# Patient Record
Sex: Male | Born: 1960 | Race: White | Hispanic: No | Marital: Married | Smoking: Never smoker
Health system: Southern US, Community
[De-identification: ages and names within clinical notes are randomized; demographics above are authoritative.]

## PROBLEM LIST (undated history)

## (undated) DIAGNOSIS — K200 Eosinophilic esophagitis: Secondary | ICD-10-CM

## (undated) DIAGNOSIS — K219 Gastro-esophageal reflux disease without esophagitis: Secondary | ICD-10-CM

## (undated) HISTORY — PX: FRACTURE SURGERY: SHX138

## (undated) HISTORY — DX: Gastro-esophageal reflux disease without esophagitis: K21.9

## (undated) HISTORY — DX: Eosinophilic esophagitis: K20.0

## (undated) HISTORY — PX: COLONOSCOPY: SHX174

## (undated) HISTORY — PX: ESOPHAGOGASTRODUODENOSCOPY: SHX1529

---

## 2017-09-24 ENCOUNTER — Encounter: Payer: Self-pay | Admitting: *Deleted

## 2017-10-16 ENCOUNTER — Ambulatory Visit (INDEPENDENT_AMBULATORY_CARE_PROVIDER_SITE_OTHER): Payer: Managed Care, Other (non HMO) | Admitting: Family Medicine

## 2017-10-16 ENCOUNTER — Encounter: Payer: Self-pay | Admitting: Family Medicine

## 2017-10-16 VITALS — BP 120/72 | HR 61 | Temp 98.1°F | Resp 17 | Ht 69.5 in | Wt 172.0 lb

## 2017-10-16 DIAGNOSIS — Z13220 Encounter for screening for lipoid disorders: Secondary | ICD-10-CM

## 2017-10-16 DIAGNOSIS — Z125 Encounter for screening for malignant neoplasm of prostate: Secondary | ICD-10-CM | POA: Diagnosis not present

## 2017-10-16 DIAGNOSIS — Z130 Encounter for screening for diseases of the blood and blood-forming organs and certain disorders involving the immune mechanism: Secondary | ICD-10-CM | POA: Diagnosis not present

## 2017-10-16 DIAGNOSIS — Z1329 Encounter for screening for other suspected endocrine disorder: Secondary | ICD-10-CM | POA: Diagnosis not present

## 2017-10-16 DIAGNOSIS — Z131 Encounter for screening for diabetes mellitus: Secondary | ICD-10-CM

## 2017-10-16 DIAGNOSIS — Z0000 Encounter for general adult medical examination without abnormal findings: Secondary | ICD-10-CM | POA: Diagnosis not present

## 2017-10-16 DIAGNOSIS — Z23 Encounter for immunization: Secondary | ICD-10-CM | POA: Diagnosis not present

## 2017-10-16 MED ORDER — ZOSTER VAC RECOMB ADJUVANTED 50 MCG/0.5ML IM SUSR: 0.5000 mL | Freq: Once | INTRAMUSCULAR | 1 refills | Status: AC

## 2017-10-16 NOTE — Patient Instructions (Addendum)
I will check some screening labs today.   See list of foods to avoid that can worsen acid reflux.  Occasional zantac over the counter if needed is fine for now, but please follow up if that does not improve.   If any return of fast heart rate - follow up to discuss further.   I will try to get prior records including exact date of last colonoscopy, and other health maintenance items.   Shingles vaccine sent to your pharmacy.   Thanks for coming in today.    Keeping you healthy  Get these tests  Blood pressure- Have your blood pressure checked once a year by your healthcare provider.  Normal blood pressure is 120/80  Weight- Have your body mass index (BMI) calculated to screen for obesity.  BMI is a measure of body fat based on height and weight. You can also calculate your own BMI at [email or web address].  Cholesterol- Have your cholesterol checked every year.  Diabetes- Have your blood sugar checked regularly if you have high blood pressure, high cholesterol, have a family history of diabetes or if you are overweight.  Screening for Colon Cancer- Colonoscopy starting at age 44.  Screening may begin sooner depending on your family history and other health conditions. Follow up colonoscopy as directed by your Gastroenterologist.  Screening for Prostate Cancer- Both blood work (PSA) and a rectal exam help screen for Prostate Cancer.  Screening begins at age 66 with African-American men and at age 24 with Caucasian men.  Screening may begin sooner depending on your family history.  Take these medicines  Aspirin- One aspirin daily can help prevent Heart disease and Stroke.  Flu shot- Every fall.  Tetanus- Every 10 years.  Zostavax- Once after the age of 51 to prevent Shingles.  Pneumonia shot- Once after the age of 47; if you are younger than 56, ask your healthcare provider if you need a Pneumonia shot.  Take these steps  Don't smoke- If you do smoke, talk to your doctor  about quitting.  For tips on how to quit, go to www.smokefree.gov or call 1-800-QUIT-NOW.  Be physically active- Exercise 5 days a week for at least 30 minutes.  If you are not already physically active start slow and gradually work up to 30 minutes of moderate physical activity.  Examples of moderate activity include walking briskly, mowing the yard, dancing, swimming, bicycling, etc.  Eat a healthy diet- Eat a variety of healthy food such as fruits, vegetables, low fat milk, low fat cheese, yogurt, lean meant, poultry, fish, beans, tofu, etc. For more information go to www.thenutritionsource.org  Drink alcohol in moderation- Limit alcohol intake to less than two drinks a day. Never drink and drive.  Dentist- Brush and floss twice daily; visit your dentist twice a year.  Depression- Your emotional health is as important as your physical health. If you're feeling down, or losing interest in things you would normally enjoy please talk to your healthcare provider.  Eye exam- Visit your eye doctor every year.  Safe sex- If you may be exposed to a sexually transmitted infection, use a condom.  Seat belts- Seat belts can save your life; always wear one.  Smoke/Carbon Monoxide detectors- These detectors need to be installed on the appropriate level of your home.  Replace batteries at least once a year.  Skin cancer- When out in the sun, cover up and use sunscreen 15 SPF or higher.  Violence- If anyone is threatening you, please tell your healthcare provider.  Living Will/ Health care power of attorney- Speak with your healthcare provider and family.  Food Choices for Gastroesophageal Reflux Disease, Adult When you have gastroesophageal reflux disease (GERD), the foods you eat and your eating habits are very important. Choosing the right foods can help ease your discomfort. What guidelines do I need to follow?  Choose fruits, vegetables, whole grains, and low-fat dairy products.  Choose  low-fat meat, fish, and poultry.  Limit fats such as oils, salad dressings, butter, nuts, and avocado.  Keep a food diary. This helps you identify foods that cause symptoms.  Avoid foods that cause symptoms. These may be different for everyone.  Eat small meals often instead of 3 large meals a day.  Eat your meals slowly, in a place where you are relaxed.  Limit fried foods.  Cook foods using methods other than frying.  Avoid drinking alcohol.  Avoid drinking large amounts of liquids with your meals.  Avoid bending over or lying down until 2-3 hours after eating. What foods are not recommended? These are some foods and drinks that may make your symptoms worse: Vegetables Tomatoes. Tomato juice. Tomato and spaghetti sauce. Chili peppers. Onion and garlic. Horseradish. Fruits Oranges, grapefruit, and lemon (fruit and juice). Meats High-fat meats, fish, and poultry. This includes hot dogs, ribs, ham, sausage, salami, and bacon. Dairy Whole milk and chocolate milk. Sour cream. Cream. Butter. Ice cream. Cream cheese. Drinks Coffee and tea. Bubbly (carbonated) drinks or energy drinks. Condiments Hot sauce. Barbecue sauce. Sweets/Desserts Chocolate and cocoa. Donuts. Peppermint and spearmint. Fats and Oils High-fat foods. This includes [place] fries and potato chips. Other Vinegar. Strong spices. This includes black pepper, white pepper, red pepper, cayenne, curry powder, cloves, ginger, and chili powder. The items listed above may not be a complete list of foods and drinks to avoid. Contact your dietitian for more information. This information is not intended to replace advice given to you by your health care provider. Make sure you discuss any questions you have with your health care provider. Document Released: 12/16/2011 Document Revised: 11/22/2015 Document Reviewed: 04/20/2013 Elsevier Interactive Patient Education  2017 [employer].   IF you received an x-ray today,  you will receive an invoice from [facility]. Please contact [facility] at [phone] with questions or concerns regarding your invoice.   IF you received labwork today, you will receive an invoice from [facility]. Please contact [facility] at [phone] with questions or concerns regarding your invoice.   Our billing staff will not be able to assist you with questions regarding bills from these companies.  You will be contacted with the lab results as soon as they are available. The fastest way to get your results is to activate your My Chart account. Instructions are located on the last page of this paperwork. If you have not heard from [place] regarding the results in 2 weeks, please contact this office.

## 2017-10-16 NOTE — Progress Notes (Signed)
Subjective:  By signing my name below, I, Dennis Crawford, attest that this documentation has been prepared under the direction and in the presence of Wendie Agreste, MD Electronically Signed: Ladene Artist, ED Scribe 10/16/2017 at 10:29 AM.   Patient ID: Dennis Crawford, male    DOB: 1961-04-28, 57 y.o.   MRN: [id number]  Chief Complaint  Patient presents with  . Annual Exam   HPI Dennis Crawford is a 57 y.o. male who presents to Primary Care at [facility] to establish care. Pt recently moved here from [place] 3 yrs ago. Pt is fasting.  Bump Behind R Ear Pt reports a nontender, non-enlarging bump behind his R ear first noticed 10 yrs ago.  Heart Burn Pt states that he intermittently has acid reflux in the evenings, once every 2-3 days, after eating oily foods such as salmon, chicken breast, mayo. Symptoms improve after drinking water and with TUMs. Denies cp, chest pressure.  Cholesterol Pt does report one elevated cholesterol reading of ~230 when he was eating out a lot but has improved his diet since. No h/o DM.  Family Hx Father diagnosed with leukemia 5 yrs ago.  CA Screening Colonoscopy: 2013 in [place], rpt in 10 yrs Prostate CA Screening: DRE done 3-4 yrs ago at last CPE, agrees to DRE and PSA today No results found for: PSA Skin CA Screening: last saw dermatology a few months ago for removal of spots on forehead and cheek; return neck yr  Immunizations  There is no immunization history on file for this patient.  Tetanus: 2010 Shingles: sent to pharmacy  Depression Screening Depression screen [facility] 2/9 10/16/2017  Decreased Interest 0  Down, Depressed, Hopeless 0  PHQ - 2 Score 0     Visual Acuity Screening   Right eye Left eye Both eyes  Without correction: 20/30 20/30 20/30   With correction:      Vision: wears reading glasses, has not seen eye doctor within 3 yrs Dentist: last seen a few yrs ago Exercise: 5-6 days/wk  There are no active problems to display for  this patient.  No past medical history on file.  Not on File Prior to Admission medications   Not on File   Social History   Socioeconomic History  . Marital status: Married    Spouse name: Not on file  . Number of children: Not on file  . Years of education: Not on file  . Highest education level: Not on file  Occupational History  . Not on file  Social Needs  . Financial resource strain: Not on file  . Food insecurity:    Worry: Not on file    Inability: Not on file  . Transportation needs:    Medical: Not on file    Non-medical: Not on file  Tobacco Use  . Smoking status: Never Smoker  . Smokeless tobacco: Never Used  Substance and Sexual Activity  . Alcohol use: Not on file  . Drug use: Not on file  . Sexual activity: Not on file  Lifestyle  . Physical activity:    Days per week: Not on file    Minutes per session: Not on file  . Stress: Not on file  Relationships  . Social connections:    Talks on phone: Not on file    Gets together: Not on file    Attends religious service: Not on file    Active member of club or organization: Not on file    Attends meetings of clubs  or organizations: Not on file    Relationship status: Not on file  . Intimate partner violence:    Fear of current or ex partner: Not on file    Emotionally abused: Not on file    Physically abused: Not on file    Forced sexual activity: Not on file  Other Topics Concern  . Not on file  Social History Narrative  . Not on file   Review of Systems  HENT:       + Bump behind R ear  Cardiovascular: Negative for chest pain.      Objective:   Physical Exam  Constitutional: He is oriented to person, place, and time. He appears well-developed and well-nourished.  HENT:  Head: Normocephalic and atraumatic.  Right Ear: External ear normal.  Left Ear: External ear normal.  Mouth/Throat: Oropharynx is clear and moist.  Firm area to R infraauricular area. ~1 cm across. No apparent  surrounding lymph.  Eyes: Pupils are equal, round, and reactive to light. Conjunctivae and EOM are normal.  Neck: Normal range of motion. Neck supple. No thyromegaly present.  Cardiovascular: Normal rate, regular rhythm, normal heart sounds and intact distal pulses.  Pulmonary/Chest: Effort normal and breath sounds normal. No respiratory distress. He has no wheezes.  Abdominal: Soft. He exhibits no distension. There is no tenderness. Hernia confirmed negative in the right inguinal area and confirmed negative in the left inguinal area.  Genitourinary: Prostate normal.  Musculoskeletal: Normal range of motion. He exhibits no edema or tenderness.  Lymphadenopathy:    He has no cervical adenopathy.  Neurological: He is alert and oriented to person, place, and time. He has normal reflexes.  Skin: Skin is warm and dry.  Psychiatric: He has a normal mood and affect. His behavior is normal.  Vitals reviewed.  Vitals:   10/16/17 0944  BP: 120/72  Pulse: 61  Resp: 17  Temp: 98.1 F (36.7 C)  TempSrc: Oral  SpO2: 98%  Weight: 172 lb (78 kg)  Height: 5' 9.5" (1.765 m)      Assessment & Plan:    Dennis Crawford is a 57 y.o. male Annual physical exam  - -anticipatory guidance as below in AVS, screening labs above. Health maintenance items as above in HPI discussed/recommended as applicable.   -Cyst versus nodular area below right ear.  Reports the same location and size for years, no recent changes.  Option of ENT eval at some point time he would like to have that further assessed or removed, sooner if any changes.  Screening for thyroid disorder - Plan: TSH  Screening, anemia, deficiency, iron - Plan: CBC  Screening for diabetes mellitus - Plan: Comprehensive metabolic panel  Screening for hyperlipidemia - Plan: Lipid panel  Screening for prostate cancer - Plan: PSA  -We discussed pros and cons of prostate cancer screening, and after this discussion, he chose to have screening done. PSA  obtained, and no concerning findings on DRE.   Need for shingles vaccine - Plan: Zoster Vaccine Adjuvanted [facility]) injection sent to pharmacy   Meds ordered this encounter  Medications  . Zoster Vaccine Adjuvanted [facility]) injection    Sig: Inject 0.5 mLs into the muscle once for 1 dose. Repeat in 2-6 months.    Dispense:  0.5 mL    Refill:  1   Patient Instructions   I will check some screening labs today.   See list of foods to avoid that can worsen acid reflux.  Occasional zantac over the counter if  needed is fine for now, but please follow up if that does not improve.   If any return of fast heart rate - follow up to discuss further.   I will try to get prior records including exact date of last colonoscopy, and other health maintenance items.   Shingles vaccine sent to your pharmacy.   Thanks for coming in today.    Keeping you healthy  Get these tests  Blood pressure- Have your blood pressure checked once a year by your healthcare provider.  Normal blood pressure is 120/80  Weight- Have your body mass index (BMI) calculated to screen for obesity.  BMI is a measure of body fat based on height and weight. You can also calculate your own BMI at [employer]Bank[employer][email or web address].  Cholesterol- Have your cholesterol checked every year.  Diabetes- Have your blood sugar checked regularly if you have high blood pressure, high cholesterol, have a family history of diabetes or if you are overweight.  Screening for Colon Cancer- Colonoscopy starting at age 45.  Screening may begin sooner depending on your family history and other health conditions. Follow up colonoscopy as directed by your Gastroenterologist.  Screening for Prostate Cancer- Both blood work (PSA) and a rectal exam help screen for Prostate Cancer.  Screening begins at age 52 with African-American men and at age 2 with Caucasian men.  Screening may begin sooner depending on your family history.  Take these  medicines  Aspirin- One aspirin daily can help prevent Heart disease and Stroke.  Flu shot- Every fall.  Tetanus- Every 10 years.  Zostavax- Once after the age of 55 to prevent Shingles.  Pneumonia shot- Once after the age of 43; if you are younger than 83, ask your healthcare provider if you need a Pneumonia shot.  Take these steps  Don't smoke- If you do smoke, talk to your doctor about quitting.  For tips on how to quit, go to www.smokefree.gov or call 1-800-QUIT-NOW.  Be physically active- Exercise 5 days a week for at least 30 minutes.  If you are not already physically active start slow and gradually work up to 30 minutes of moderate physical activity.  Examples of moderate activity include walking briskly, mowing the yard, dancing, swimming, bicycling, etc.  Eat a healthy diet- Eat a variety of healthy food such as fruits, vegetables, low fat milk, low fat cheese, yogurt, lean meant, poultry, fish, beans, tofu, etc. For more information go to www.thenutritionsource.org  Drink alcohol in moderation- Limit alcohol intake to less than two drinks a day. Never drink and drive.  Dentist- Brush and floss twice daily; visit your dentist twice a year.  Depression- Your emotional health is as important as your physical health. If you're feeling down, or losing interest in things you would normally enjoy please talk to your healthcare provider.  Eye exam- Visit your eye doctor every year.  Safe sex- If you may be exposed to a sexually transmitted infection, use a condom.  Seat belts- Seat belts can save your life; always wear one.  Smoke/Carbon Monoxide detectors- These detectors need to be installed on the appropriate level of your home.  Replace batteries at least once a year.  Skin cancer- When out in the sun, cover up and use sunscreen 15 SPF or higher.  Violence- If anyone is threatening you, please tell your healthcare provider.  Living Will/ Health care power of attorney-  Speak with your healthcare provider and family.  Food Choices for Gastroesophageal Reflux Disease, Adult When you have  gastroesophageal reflux disease (GERD), the foods you eat and your eating habits are very important. Choosing the right foods can help ease your discomfort. What guidelines do I need to follow?  Choose fruits, vegetables, whole grains, and low-fat dairy products.  Choose low-fat meat, fish, and poultry.  Limit fats such as oils, salad dressings, butter, nuts, and avocado.  Keep a food diary. This helps you identify foods that cause symptoms.  Avoid foods that cause symptoms. These may be different for everyone.  Eat small meals often instead of 3 large meals a day.  Eat your meals slowly, in a place where you are relaxed.  Limit fried foods.  Cook foods using methods other than frying.  Avoid drinking alcohol.  Avoid drinking large amounts of liquids with your meals.  Avoid bending over or lying down until 2-3 hours after eating. What foods are not recommended? These are some foods and drinks that may make your symptoms worse: Vegetables Tomatoes. Tomato juice. Tomato and spaghetti sauce. Chili peppers. Onion and garlic. Horseradish. Fruits Oranges, grapefruit, and lemon (fruit and juice). Meats High-fat meats, fish, and poultry. This includes hot dogs, ribs, ham, sausage, salami, and bacon. Dairy Whole milk and chocolate milk. Sour cream. Cream. Butter. Ice cream. Cream cheese. Drinks Coffee and tea. Bubbly (carbonated) drinks or energy drinks. Condiments Hot sauce. Barbecue sauce. Sweets/Desserts Chocolate and cocoa. Donuts. Peppermint and spearmint. Fats and Oils High-fat foods. This includes [place] fries and potato chips. Other Vinegar. Strong spices. This includes black pepper, white pepper, red pepper, cayenne, curry powder, cloves, ginger, and chili powder. The items listed above may not be a complete list of foods and drinks to avoid. Contact  your dietitian for more information. This information is not intended to replace advice given to you by your health care provider. Make sure you discuss any questions you have with your health care provider. Document Released: 12/16/2011 Document Revised: 11/22/2015 Document Reviewed: 04/20/2013 Elsevier Interactive Patient Education  2017 [employer].   IF you received an x-ray today, you will receive an invoice from [facility]. Please contact [facility] at [phone] with questions or concerns regarding your invoice.   IF you received labwork today, you will receive an invoice from [facility]. Please contact [facility] at [phone] with questions or concerns regarding your invoice.   Our billing staff will not be able to assist you with questions regarding bills from these companies.  You will be contacted with the lab results as soon as they are available. The fastest way to get your results is to activate your My Chart account. Instructions are located on the last page of this paperwork. If you have not heard from [place] regarding the results in 2 weeks, please contact this office.       I personally performed the services described in this documentation, which was scribed in my presence. The recorded information has been reviewed and considered for accuracy and completeness, addended by me as needed, and agree with information above.  Signed,   Merri Ray, MD [facility] at [place].  10/16/17 2:15 PM

## 2017-10-17 LAB — COMPREHENSIVE METABOLIC PANEL
A/G RATIO: 1.8 (ref 1.2–2.2)
ALBUMIN: 4.7 g/dL (ref 3.5–5.5)
ALT: 15 IU/L (ref 0–44)
AST: 10 IU/L (ref 0–40)
Alkaline Phosphatase: 47 IU/L (ref 39–117)
BUN/Creatinine Ratio: 15 (ref 9–20)
BUN: 15 mg/dL (ref 6–24)
Bilirubin Total: 0.9 mg/dL (ref 0.0–1.2)
CALCIUM: 9.9 mg/dL (ref 8.7–10.2)
CO2: 23 mmol/L (ref 20–29)
CREATININE: 1.02 mg/dL (ref 0.76–1.27)
Chloride: 99 mmol/L (ref 96–106)
GFR, EST AFRICAN AMERICAN: 95 mL/min/{1.73_m2} (ref >59)
GFR, EST NON AFRICAN AMERICAN: 82 mL/min/{1.73_m2} (ref >59)
GLOBULIN, TOTAL: 2.6 g/dL (ref 1.5–4.5)
Glucose: 93 mg/dL (ref 65–99)
Potassium: 5 mmol/L (ref 3.5–5.2)
SODIUM: 139 mmol/L (ref 134–144)
TOTAL PROTEIN: 7.3 g/dL (ref 6.0–8.5)

## 2017-10-17 LAB — LIPID PANEL
CHOL/HDL RATIO: 2.3 ratio (ref 0.0–5.0)
Cholesterol, Total: 237 mg/dL — ABNORMAL HIGH (ref 100–199)
HDL: 102 mg/dL (ref >39)
LDL CALC: 125 mg/dL — AB (ref 0–99)
TRIGLYCERIDES: 48 mg/dL (ref 0–149)
VLDL Cholesterol Cal: 10 mg/dL (ref 5–40)

## 2017-10-17 LAB — CBC
HEMATOCRIT: 44.8 % (ref 37.5–51.0)
HEMOGLOBIN: 14.9 g/dL (ref 13.0–17.7)
MCH: 29.5 pg (ref 26.6–33.0)
MCHC: 33.3 g/dL (ref 31.5–35.7)
MCV: 89 fL (ref 79–97)
Platelets: 312 10*3/uL (ref 150–379)
RBC: 5.05 x10E6/uL (ref 4.14–5.80)
RDW: 13.3 % (ref 12.3–15.4)
WBC: 5.5 10*3/uL (ref 3.4–10.8)

## 2017-10-17 LAB — PSA: Prostate Specific Ag, Serum: 0.8 ng/mL (ref 0.0–4.0)

## 2017-10-17 LAB — TSH: TSH: 1.61 u[IU]/mL (ref 0.450–4.500)

## 2018-01-31 ENCOUNTER — Inpatient Hospital Stay (HOSPITAL_COMMUNITY)
Admission: EM | Admit: 2018-01-31 | Discharge: 2018-02-02 | DRG: 200 | Disposition: A | Discharge status: Home / Self Care | Payer: Managed Care, Other (non HMO) | Attending: Surgery | Admitting: Surgery

## 2018-01-31 ENCOUNTER — Emergency Department (HOSPITAL_COMMUNITY): Payer: Managed Care, Other (non HMO)

## 2018-01-31 ENCOUNTER — Other Ambulatory Visit: Payer: Self-pay

## 2018-01-31 ENCOUNTER — Observation Stay (HOSPITAL_COMMUNITY): Payer: Managed Care, Other (non HMO)

## 2018-01-31 ENCOUNTER — Encounter (HOSPITAL_COMMUNITY): Payer: Self-pay

## 2018-01-31 DIAGNOSIS — S270XXA Traumatic pneumothorax, initial encounter: Secondary | ICD-10-CM | POA: Diagnosis not present

## 2018-01-31 DIAGNOSIS — M549 Dorsalgia, unspecified: Secondary | ICD-10-CM | POA: Diagnosis not present

## 2018-01-31 DIAGNOSIS — Z880 Allergy status to penicillin: Secondary | ICD-10-CM

## 2018-01-31 DIAGNOSIS — S40211A Abrasion of right shoulder, initial encounter: Secondary | ICD-10-CM | POA: Diagnosis present

## 2018-01-31 DIAGNOSIS — J939 Pneumothorax, unspecified: Secondary | ICD-10-CM | POA: Diagnosis present

## 2018-01-31 DIAGNOSIS — Y9355 Activity, bike riding: Secondary | ICD-10-CM

## 2018-01-31 DIAGNOSIS — S2231XA Fracture of one rib, right side, initial encounter for closed fracture: Secondary | ICD-10-CM

## 2018-01-31 LAB — I-STAT CHEM 8, ED
BUN: 25 mg/dL — ABNORMAL HIGH (ref 6–20)
CHLORIDE: 104 mmol/L (ref 98–111)
Calcium, Ion: 1.18 mmol/L (ref 1.15–1.40)
Creatinine, Ser: 1 mg/dL (ref 0.61–1.24)
GLUCOSE: 136 mg/dL — AB (ref 70–99)
HCT: 44 % (ref 39.0–52.0)
Hemoglobin: 15 g/dL (ref 13.0–17.0)
Potassium: 4.6 mmol/L (ref 3.5–5.1)
Sodium: 137 mmol/L (ref 135–145)
TCO2: 24 mmol/L (ref 22–32)

## 2018-01-31 MED ORDER — ACETAMINOPHEN 325 MG PO TABS: 650.0000 mg | ORAL_TABLET | Freq: Four times a day (QID) | ORAL | Status: DC | PRN

## 2018-01-31 MED ORDER — ONDANSETRON HCL 4 MG/2ML IJ SOLN: 4.0000 mg | Freq: Four times a day (QID) | INTRAMUSCULAR | Status: DC | PRN

## 2018-01-31 MED ORDER — HYDROCODONE-ACETAMINOPHEN 5-325 MG PO TABS
1.0000 | ORAL_TABLET | ORAL | Status: DC | PRN
Start: 2018-01-31 — End: 2018-02-02
  Administered 2018-01-31 – 2018-02-02 (×7): 2 via ORAL
  Filled 2018-01-31 (×7): qty 2

## 2018-01-31 MED ORDER — KETAMINE HCL 50 MG/5ML IJ SOSY
1.0000 mg/kg | PREFILLED_SYRINGE | Freq: Once | INTRAMUSCULAR | Status: AC
  Administered 2018-01-31: 75 mg via INTRAVENOUS
  Filled 2018-01-31: qty 10

## 2018-01-31 MED ORDER — ACETAMINOPHEN 650 MG RE SUPP: 650.0000 mg | Freq: Four times a day (QID) | RECTAL | Status: DC | PRN

## 2018-01-31 MED ORDER — ONDANSETRON 4 MG PO TBDP: 4.0000 mg | ORAL_TABLET | Freq: Four times a day (QID) | ORAL | Status: DC | PRN

## 2018-01-31 MED ORDER — FENTANYL CITRATE (PF) 100 MCG/2ML IJ SOLN
50.0000 ug | Freq: Once | INTRAMUSCULAR | Status: AC
  Administered 2018-01-31: 50 ug via INTRAVENOUS
  Filled 2018-01-31: qty 2

## 2018-01-31 MED ORDER — BACITRACIN ZINC 500 UNIT/GM EX OINT
TOPICAL_OINTMENT | Freq: Two times a day (BID) | CUTANEOUS | Status: DC
  Administered 2018-01-31 – 2018-02-01 (×2): 31.5556 via TOPICAL
  Administered 2018-02-01: 1 via TOPICAL
  Administered 2018-02-02: 31.5556 via TOPICAL
  Filled 2018-01-31: qty 28.4

## 2018-01-31 MED ORDER — LIDOCAINE-EPINEPHRINE (PF) 2 %-1:200000 IJ SOLN
INTRAMUSCULAR | Status: AC
  Filled 2018-01-31: qty 20

## 2018-01-31 MED ORDER — LIDOCAINE-EPINEPHRINE (PF) 2 %-1:200000 IJ SOLN
20.0000 mL | Freq: Once | INTRAMUSCULAR | Status: AC
  Administered 2018-01-31: 20 mL via INTRADERMAL

## 2018-01-31 MED ORDER — KCL IN DEXTROSE-NACL 20-5-0.45 MEQ/L-%-% IV SOLN
INTRAVENOUS | Status: DC
  Administered 2018-01-31: 14:00:00 via INTRAVENOUS
  Filled 2018-01-31: qty 1000

## 2018-01-31 MED ORDER — PROPOFOL 10 MG/ML IV BOLUS
0.5000 mg/kg | Freq: Once | INTRAVENOUS | Status: AC
  Administered 2018-01-31: 37.4 mg via INTRAVENOUS
  Filled 2018-01-31: qty 20
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
## 2018-02-01 ENCOUNTER — Observation Stay (HOSPITAL_COMMUNITY): Payer: Managed Care, Other (non HMO)

## 2018-02-01 DIAGNOSIS — M549 Dorsalgia, unspecified: Secondary | ICD-10-CM | POA: Diagnosis present

## 2018-02-01 DIAGNOSIS — S2231XA Fracture of one rib, right side, initial encounter for closed fracture: Secondary | ICD-10-CM | POA: Diagnosis present

## 2018-02-01 DIAGNOSIS — S40211A Abrasion of right shoulder, initial encounter: Secondary | ICD-10-CM | POA: Diagnosis present

## 2018-02-01 DIAGNOSIS — J939 Pneumothorax, unspecified: Secondary | ICD-10-CM | POA: Diagnosis present

## 2018-02-01 DIAGNOSIS — Y9355 Activity, bike riding: Secondary | ICD-10-CM | POA: Diagnosis not present

## 2018-02-01 DIAGNOSIS — S270XXA Traumatic pneumothorax, initial encounter: Secondary | ICD-10-CM | POA: Diagnosis present

## 2018-02-01 DIAGNOSIS — Z880 Allergy status to penicillin: Secondary | ICD-10-CM | POA: Diagnosis not present

## 2018-02-01 MED ORDER — TRAMADOL HCL 50 MG PO TABS
50.0000 mg | ORAL_TABLET | Freq: Four times a day (QID) | ORAL | Status: DC | PRN
  Administered 2018-02-01: 50 mg via ORAL
  Filled 2018-02-01: qty 1

## 2018-02-01 MED ORDER — ENOXAPARIN SODIUM 40 MG/0.4ML ~~LOC~~ SOLN
40.0000 mg | SUBCUTANEOUS | Status: DC
  Filled 2018-02-01 (×2): qty 0.4
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
## 2018-02-02 ENCOUNTER — Inpatient Hospital Stay (HOSPITAL_COMMUNITY): Payer: Managed Care, Other (non HMO)

## 2018-02-02 MED ORDER — HYDROCODONE-ACETAMINOPHEN 5-325 MG PO TABS: 1.0000 | ORAL_TABLET | Freq: Four times a day (QID) | ORAL | 0 refills | Status: DC | PRN
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
## 2018-02-02 NOTE — Discharge Instructions (Signed)
1. PAIN CONTROL:  1. Pain is best controlled by a usual combination of three different methods TOGETHER:  1. Ice/Heat 2. Over the counter pain medication 3. Prescription pain medication 2. Most patients will experience some swelling and bruising around wounds. Ice packs or heating pads (30-60 minutes up to 6 times a day) will help. Use ice for the first few days to help decrease swelling and bruising, then switch to heat to help relax tight/sore spots and speed recovery. Some people prefer to use ice alone, heat alone, alternating between ice & heat. Experiment to what works for you. Swelling and bruising can take several weeks to resolve.  3. It is helpful to take an over-the-counter pain medication regularly for the first few weeks. Choose one of the following that works best for you:  1. Naproxen (Aleve, etc) Two 220mg  tabs twice a day 2. Ibuprofen (Advil, etc) Three 200mg  tabs four times a day (every meal & bedtime) 3. Acetaminophen (Tylenol, etc) 500-650mg  four times a day (every meal & bedtime) 4. A prescription for pain medication (such as oxycodone, hydrocodone, etc) should be given to you upon discharge. Take your pain medication as prescribed.  1. If you are having problems/concerns with the prescription medicine (does not control pain, nausea, vomiting, rash, itching, etc), please call us [phone] to see if we need to switch you to a different pain medicine that will work better for you and/or control your side effect better. 2. If you need a refill on your pain medication, please contact your pharmacy. They will contact our office to request authorization. Prescriptions will not be filled after 5 pm or on week-ends. 4. Avoid getting constipated. When taking pain medications, it is common to experience some constipation. Increasing fluid intake and taking a fiber supplement (such as Metamucil, Citrucel, FiberCon, MiraLax, etc) 1-2 times a day regularly will usually help prevent this  problem from occurring. A mild laxative (prune juice, Milk of Magnesia, MiraLax, etc) should be taken according to package directions if there are no bowel movements after 48 hours.  5. Watch out for diarrhea. If you have many loose bowel movements, simplify your diet to bland foods & liquids for a few days. Stop any stool softeners and decrease your fiber supplement. Switching to mild anti-diarrheal medications (Kayopectate, Pepto Bismol) can help. If this worsens or does not improve, please call us. 6. Wash / shower every day. You may shower daily and replace your bandges after showering. No bathing or submerging your wounds in water until they heal. 7. FOLLOW UP in our office  1. Please call [facility] at [phone] to set up an appointment for a follow-up appointment approximately 2-3 weeks after discharge for wound check  WHEN TO CALL us [phone]:  1. Poor pain control 2. Reactions / problems with new medications (rash/itching, nausea, etc)  3. Fever over 101.5 F (38.5 C) 4. Worsening swelling or bruising 5. Continued bleeding from wounds. 6. Increased pain, redness, or drainage from the wounds which could be signs of infection  The clinic staff is available to answer your questions during regular business hours (8:30am-5pm). Please dont hesitate to call and ask to speak to one of our nurses for clinical concerns.  If you have a medical emergency, go to the nearest emergency room or call 911.  A surgeon from [facility] is always on call at the [facility], [place], [place], [place], [place] [address] ?  MAIN: [phone] ? TOLL FREE: [phone] ?  FAX [phone]  [email or web address]   Rib Fracture A rib fracture is a break or crack in one of the bones of the ribs. The ribs are a group of long, curved bones that wrap around your chest and attach to your spine. They protect your lungs and other organs in  the chest cavity. A broken or cracked rib is often painful, but most do not cause other problems. Most rib fractures heal on their own over time. However, rib fractures can be more serious if multiple ribs are broken or if broken ribs move out of place and push against other structures. What are the causes?  A direct blow to the chest. For example, this could happen during contact sports, a car accident, or a fall against a hard object.  Repetitive movements with high force, such as pitching a baseball or having severe coughing spells. What are the signs or symptoms?  Pain when you breathe in or cough.  Pain when someone presses on the injured area. How is this diagnosed? Your caregiver will perform a physical exam. Various imaging tests may be ordered to confirm the diagnosis and to look for related injuries. These tests may include a chest X-ray, computed tomography (CT), magnetic resonance imaging (MRI), or a bone scan. How is this treated? Rib fractures usually heal on their own in 1-3 months. The longer healing period is often associated with a continued cough or other aggravating activities. During the healing period, pain control is very important. Medication is usually given to control pain. Hospitalization or surgery may be needed for more severe injuries, such as those in which multiple ribs are broken or the ribs have moved out of place. Follow these instructions at home:  Avoid strenuous activity and any activities or movements that cause pain. Be careful during activities and avoid bumping the injured rib.  Gradually increase activity as directed by your caregiver.  Only take over-the-counter or prescription medications as directed by your caregiver. Do not take other medications without asking your caregiver first.  Apply ice to the injured area for the first 1-2 days after you have been treated or as directed by your caregiver. Applying ice helps to reduce inflammation and  pain. ? Put ice in a plastic bag. ? Place a towel between your skin and the bag. ? Leave the ice on for 15-20 minutes at a time, every 2 hours while you are awake.  Perform deep breathing as directed by your caregiver. This will help prevent pneumonia, which is a common complication of a broken rib. Your caregiver may instruct you to: ? Take deep breaths several times a day. ? Try to cough several times a day, holding a pillow against the injured area. ? Use a device called an incentive spirometer to practice deep breathing several times a day.  Drink enough fluids to keep your urine clear or pale yellow. This will help you avoid constipation.  Do not wear a rib belt or binder. These restrict breathing, which can lead to pneumonia. Get help right away if:  You have a fever.  You have difficulty breathing or shortness of breath.  You develop a continual cough, or you cough up thick or bloody sputum.  You feel sick to your stomach (nausea), throw up (vomit), or have abdominal pain.  You have worsening pain not controlled with medications. This information is not intended to replace advice given to you by your health care provider.  Make sure you discuss any questions you have with your health care provider. Document Released: 06/16/2005 Document Revised: 11/22/2015 Document Reviewed: 08/18/2012 Elsevier Interactive Patient Education  2018 [employer].    Pneumothorax A pneumothorax, commonly called a collapsed lung, is a condition in which air leaks from a lung and builds up in the space between the lung and the chest wall (pleural space). The air in a pneumothorax is trapped outside the lung and takes up space, preventing the lung from fully expanding. This is a condition that usually occurs suddenly. The buildup of air may be small or large. A small pneumothorax may go away on its own. When a pneumothorax is larger, it will often require medical treatment and hospitalization. What  are the causes? A pneumothorax can sometimes happen quickly with no apparent cause. People with underlying lung problems, particularly COPD or emphysema, are at higher risk of pneumothorax. However, pneumothorax can happen quickly even in people with no prior known lung problems. Trauma, surgery, medical procedures, or injury to the chest wall can also cause a pneumothorax. What are the signs or symptoms? Sometimes a pneumothorax will have no symptoms. When symptoms are present, they can include:  Chest pain.  Shortness of breath.  Increased rate of breathing.  Bluish color to your lips or skin (cyanosis).  How is this diagnosed? Pneumothorax is usually diagnosed by a chest X-ray or chest CT scan. Your health care provider will also take a medical history and perform a physical exam to determine why you may have a pneumothorax. How is this treated? A small pneumothorax may go away on its own without treatment. Extra oxygen can sometimes help a small pneumothorax go away more quickly. For a larger pneumothorax or a pneumothorax that is causing symptoms, a procedure is usually needed to drain the air.In some cases, the health care provider may drain the air using a needle. In other cases, a chest tube may be inserted into the pleural space. A chest tube is a small tube placed between the ribs and into the pleural space. This removes the extra air and allows the lung to expand back to its normal size. A large pneumothorax will usually require a hospital stay. If there is ongoing air leakage into the pleural space, then the chest tube may need to remain in place for several days until the air leak has healed. In some cases, surgery may be needed. Follow these instructions at home:  Only take over-the-counter or prescription medicines as directed by your health care provider.  If a cough or pain makes it difficult for you to sleep at night, try sleeping in a semi-upright position in a recliner or by  using 2 or 3 pillows.  Rest and limit activity as directed by your health care provider.  If you had a chest tube and it was removed, ask your health care provider when it is okay to remove the dressing. Until your health care provider says you can remove the dressing, do not allow it to get wet.  Do not smoke. Smoking is a risk factor for pneumothorax.  Do not fly in an airplane or scuba dive until your health care provider says it is okay.  Follow up with your health care provider as directed. Get help right away if:  You have increasing chest pain or shortness of breath.  You have a cough that is not controlled with suppressants.  You begin coughing up blood.  You have pain that is getting worse  or is not controlled with medicines.  You cough up thick, discolored mucus (sputum) that is yellow to green in color.  You have redness, increasing pain, or discharge at the site where a chest tube had been in place (if your pneumothorax was treated with a chest tube).  The site where your chest tube was located opens up.  You feel air coming out of the site where the chest tube was placed.  You have a fever or persistent symptoms for more than 2-3 days.  You have a fever and your symptoms suddenly get worse. This information is not intended to replace advice given to you by your health care provider. Make sure you discuss any questions you have with your health care provider. Document Released: 06/16/2005 Document Revised: 11/22/2015 Document Reviewed: 11/09/2013 Elsevier Interactive Patient Education  [employer].

## 2018-02-15 ENCOUNTER — Ambulatory Visit
Admission: RE | Admit: 2018-02-15 | Discharge: 2018-02-15 | Disposition: A | Discharge status: Home / Self Care | Payer: Managed Care, Other (non HMO) | Source: Ambulatory Visit | Attending: General Surgery | Admitting: General Surgery

## 2018-02-15 ENCOUNTER — Other Ambulatory Visit: Payer: Self-pay | Admitting: General Surgery

## 2018-02-15 DIAGNOSIS — J939 Pneumothorax, unspecified: Secondary | ICD-10-CM

## 2019-02-28 ENCOUNTER — Other Ambulatory Visit: Payer: Self-pay

## 2019-02-28 DIAGNOSIS — Z20822 Contact with and (suspected) exposure to covid-19: Secondary | ICD-10-CM

## 2019-03-01 LAB — NOVEL CORONAVIRUS, NAA: SARS-CoV-2, NAA: NOT DETECTED

## 2019-09-03 ENCOUNTER — Ambulatory Visit: Payer: Managed Care, Other (non HMO) | Attending: Internal Medicine

## 2019-09-03 DIAGNOSIS — Z23 Encounter for immunization: Secondary | ICD-10-CM

## 2019-09-03 NOTE — Progress Notes (Signed)
   Covid-19 Vaccination Clinic  Name:  Jansiel Wilke    MRN: [place]:[id number] DOB: 1960/08/14  09/03/2019  Mr. Richendollar was observed post Covid-19 immunization for 15 minutes without incident. He was provided with Vaccine Information Sheet and instruction to access the V-Safe system.   Mr. Niemczyk was instructed to call 911 with any severe reactions post vaccine: [place] Kitchen Difficulty breathing  . Swelling of face and throat  . A fast heartbeat  . A bad rash all over body  . Dizziness and weakness   Immunizations Administered    Name Date Dose VIS Date Route   Pfizer COVID-19 Vaccine 09/03/2019 10:27 AM 0.3 mL 06/10/2019 Intramuscular   Manufacturer: [place]   Lot: [id number]   [place]: [id number]

## 2019-09-05 ENCOUNTER — Ambulatory Visit: Payer: Managed Care, Other (non HMO)

## 2019-09-24 ENCOUNTER — Ambulatory Visit: Payer: Managed Care, Other (non HMO) | Attending: Internal Medicine

## 2019-09-24 DIAGNOSIS — Z23 Encounter for immunization: Secondary | ICD-10-CM

## 2019-09-24 NOTE — Progress Notes (Signed)
   Covid-19 Vaccination Clinic  Name:  Dennis Crawford    MRN: [place]:[id number] DOB: 12-Jun-1961  09/24/2019  Mr. Dennis Crawford was observed post Covid-19 immunization for 15 minutes without incident. He was provided with Vaccine Information Sheet and instruction to access the V-Safe system.   Mr. Dennis Crawford was instructed to call 911 with any severe reactions post vaccine: [place] Kitchen Difficulty breathing  . Swelling of face and throat  . A fast heartbeat  . A bad rash all over body  . Dizziness and weakness   Immunizations Administered    Name Date Dose VIS Date Route   Pfizer COVID-19 Vaccine 09/24/2019 12:08 PM 0.3 mL 06/10/2019 Intramuscular   Manufacturer: [place]   Lot: [id number]   [place]: [id number]

## 2020-04-19 ENCOUNTER — Ambulatory Visit (INDEPENDENT_AMBULATORY_CARE_PROVIDER_SITE_OTHER): Payer: Managed Care, Other (non HMO) | Admitting: Family Medicine

## 2020-04-19 ENCOUNTER — Other Ambulatory Visit: Payer: Self-pay

## 2020-04-19 ENCOUNTER — Encounter: Payer: Self-pay | Admitting: Family Medicine

## 2020-04-19 VITALS — BP 128/75 | HR 54 | Temp 97.9°F | Ht 69.0 in | Wt 167.0 lb

## 2020-04-19 DIAGNOSIS — Z130 Encounter for screening for diseases of the blood and blood-forming organs and certain disorders involving the immune mechanism: Secondary | ICD-10-CM

## 2020-04-19 DIAGNOSIS — Z806 Family history of leukemia: Secondary | ICD-10-CM

## 2020-04-19 DIAGNOSIS — Z0001 Encounter for general adult medical examination with abnormal findings: Secondary | ICD-10-CM

## 2020-04-19 DIAGNOSIS — Z125 Encounter for screening for malignant neoplasm of prostate: Secondary | ICD-10-CM

## 2020-04-19 DIAGNOSIS — Z23 Encounter for immunization: Secondary | ICD-10-CM

## 2020-04-19 DIAGNOSIS — Z0000 Encounter for general adult medical examination without abnormal findings: Secondary | ICD-10-CM

## 2020-04-19 DIAGNOSIS — Z131 Encounter for screening for diabetes mellitus: Secondary | ICD-10-CM

## 2020-04-19 DIAGNOSIS — Z1159 Encounter for screening for other viral diseases: Secondary | ICD-10-CM

## 2020-04-19 DIAGNOSIS — Z114 Encounter for screening for human immunodeficiency virus [HIV]: Secondary | ICD-10-CM | POA: Diagnosis not present

## 2020-04-19 DIAGNOSIS — Z13220 Encounter for screening for lipoid disorders: Secondary | ICD-10-CM

## 2020-04-19 DIAGNOSIS — H61891 Other specified disorders of right external ear: Secondary | ICD-10-CM

## 2020-04-19 LAB — COMPREHENSIVE METABOLIC PANEL
Chloride: 98 mmol/L (ref 96–106)
GFR calc Af Amer: 106 mL/min/{1.73_m2} (ref >59)
Globulin, Total: 2.5 g/dL (ref 1.5–4.5)

## 2020-04-19 LAB — CBC: WBC: 7.1 10*3/uL (ref 3.4–10.8)

## 2020-04-19 NOTE — Progress Notes (Signed)
Subjective:  Patient ID: Dennis Crawford, male    DOB: [birth year]  Age: 59 y.o. MRN: [id number]  CC:  Chief Complaint  Patient presents with  . Annual Exam    PT reports he feels great with no complaints.    HPI Dennis Crawford presents for  Annual physical exam.  I last saw him in April 2019 for physical exam. History of heartburn, hyperlipidemia, family history of leukemia in father. Since last visit had rib fracture, pneumothorax in 01/2018. Doing well now. Only rare soreness in side with extra weight on shoulders.   Hyperlipidemia:  Initial diet/exercise approach planned with repeat testing in 3 to 6 months in 2019. 5 # weight loss since that time. Watching diet more. Riding mtn bike, workouts.  No FH of CAD, but brother had some buildup on coronary calcium scoring.   Wt Readings from Last 3 Encounters:  04/19/20 167 lb (75.8 kg)  01/31/18 165 lb (74.8 kg)  10/16/17 172 lb (78 kg)    Lab Results  Component Value Date   CHOL 237 (H) 10/16/2017   HDL 102 10/16/2017   LDLCALC 125 (H) 10/16/2017   TRIG 48 10/16/2017   CHOLHDL 2.3 10/16/2017   Lab Results  Component Value Date   ALT 15 10/16/2017   AST 10 10/16/2017   ALKPHOS 47 10/16/2017   BILITOT 0.9 10/16/2017   Cancer screening: Colon: 2013 in [place]. Repeat in 10 yrs.  The natural history of prostate cancer and ongoing controversy regarding screening and potential treatment outcomes of prostate cancer has been discussed with the patient. The meaning of a false positive PSA and a false negative PSA has been discussed. He indicates understanding of the limitations of this screening test and wishes  to proceed with screening PSA testing, deferred DRE.   Lab Results  Component Value Date   PSA1 0.8 10/16/2017   Immunization History  Administered Date(s) Administered  . Influenza,inj,Quad PF,6+ Mos 04/19/2020  . PFIZER SARS-COV-2 Vaccination 09/03/2019, 09/24/2019  . Tdap 04/19/2020  flu vaccine: today.    Plans  Tdap: due.  Shingles: has not received that he knows.  Depression screen [facility] 2/9 04/19/2020 10/16/2017  Decreased Interest 0 0  Down, Depressed, Hopeless 0 0  PHQ - 2 Score 0 0    Hearing Screening   125Hz  250Hz  500Hz  1000Hz  2000Hz  3000Hz  4000Hz  6000Hz  8000Hz   Right ear:           Left ear:             Visual Acuity Screening   Right eye Left eye Both eyes  Without correction:     With correction: 20/15 20/15-1 20/15  optho 2 weeks ago.  Dental: Dr. Gloriann Loan every 6 months.   Exercise: as above. Over 150 min per week as above. F3 workouts.   Bump behind behind R ear for 10 years, no recent changes.  No prior eval. No pain.  Measured about 1 cm last year.   History Patient Active Problem List   Diagnosis Date Noted  . Pneumothorax 02/01/2018  . Pneumothorax on right 01/31/2018   History reviewed. No pertinent past medical history. Past Surgical History:  Procedure Laterality Date  . FRACTURE SURGERY N/A    Phreesia 04/17/2020   Allergies  Allergen Reactions  . Penicillins Hives    Pt had severe hives - was told to never take again Has patient had a PCN reaction causing immediate rash, facial/tongue/throat swelling, SOB or lightheadedness with hypotension: No Has patient had a  PCN reaction causing severe rash involving mucus membranes or skin necrosis: No Has patient had a PCN reaction that required hospitalization: No Has patient had a PCN reaction occurring within the last 10 years: No If all of the above answers are "NO", then may proceed with Cephalosporin use.    Prior to Admission medications   Medication Sig Start Date End Date Taking? Authorizing Provider  HYDROcodone-acetaminophen (NORCO/VICODIN) 5-325 MG tablet Take 1 tablet by mouth every 6 (six) hours as needed for moderate pain. Patient not taking: Reported on 04/19/2020 02/02/18   Kalman Drape, PA   Social History   Socioeconomic History  . Marital status: Married    Spouse name: Not on  file  . Number of children: Not on file  . Years of education: Not on file  . Highest education level: Not on file  Occupational History  . Not on file  Tobacco Use  . Smoking status: Never Smoker  . Smokeless tobacco: Never Used  Vaping Use  . Vaping Use: Never used  Substance and Sexual Activity  . Alcohol use: Yes    Alcohol/week: 4.0 standard drinks    Types: 4 Cans of beer per week  . Drug use: Never  . Sexual activity: Yes  Other Topics Concern  . Not on file  Social History Narrative  . Not on file   Social Determinants of Health   Financial Resource Strain:   . Difficulty of Paying Living Expenses: Not on file  Food Insecurity:   . Worried About Charity fundraiser in the Last Year: Not on file  . Ran Out of Food in the Last Year: Not on file  Transportation Needs:   . Lack of Transportation (Medical): Not on file  . Lack of Transportation (Non-Medical): Not on file  Physical Activity:   . Days of Exercise per Week: Not on file  . Minutes of Exercise per Session: Not on file  Stress:   . Feeling of Stress : Not on file  Social Connections:   . Frequency of Communication with Friends and Family: Not on file  . Frequency of Social Gatherings with Friends and Family: Not on file  . Attends Religious Services: Not on file  . Active Member of Clubs or Organizations: Not on file  . Attends Archivist Meetings: Not on file  . Marital Status: Not on file  Intimate Partner Violence:   . Fear of Current or Ex-Partner: Not on file  . Emotionally Abused: Not on file  . Physically Abused: Not on file  . Sexually Abused: Not on file    Review of Systems 13 point review of systems per patient health survey noted.  Negative other than as indicated above or in HPI.    Objective:   Vitals:   04/19/20 0951  BP: 128/75  Pulse: (!) 54  Temp: 97.9 F (36.6 C)  TempSrc: Temporal  SpO2: 100%  Weight: 167 lb (75.8 kg)  Height: 5\' 9"  (1.753 m)      Physical Exam Vitals reviewed.  Constitutional:      Appearance: He is well-developed.  HENT:     Head: Normocephalic and atraumatic.      Right Ear: External ear normal.     Left Ear: External ear normal.  Eyes:     Conjunctiva/sclera: Conjunctivae normal.     Pupils: Pupils are equal, round, and reactive to light.  Neck:     Thyroid: No thyromegaly.  Cardiovascular:  Rate and Rhythm: Normal rate and regular rhythm.     Heart sounds: Normal heart sounds.  Pulmonary:     Effort: Pulmonary effort is normal. No respiratory distress.     Breath sounds: Normal breath sounds. No wheezing.  Abdominal:     General: There is no distension.     Palpations: Abdomen is soft.     Tenderness: There is no abdominal tenderness.  Musculoskeletal:        General: No tenderness. Normal range of motion.     Cervical back: Normal range of motion and neck supple.  Lymphadenopathy:     Cervical: No cervical adenopathy.  Skin:    General: Skin is warm and dry.  Neurological:     Mental Status: He is alert and oriented to person, place, and time.     Deep Tendon Reflexes: Reflexes are normal and symmetric.  Psychiatric:        Behavior: Behavior normal.     Assessment & Plan:  Dennis Crawford is a 59 y.o. male . Annual physical exam  - -anticipatory guidance as below in AVS, screening labs above. Health maintenance items as above in HPI discussed/recommended as applicable.   Screening for HIV without presence of risk factors - Plan: HIV antibody (with reflex)  Need for hepatitis C screening test - Plan: Hepatitis C antibody  Need for vaccination - Plan: Tdap vaccine greater than or equal to 7yo IM, Flu Vaccine QUAD 36+ mos IM  Screening for hyperlipidemia - Plan: Lipid panel, Comprehensive metabolic panel  -Check lipids, consider coronary calcium scoring for further information depending on levels.  Screening for diabetes mellitus - Plan: Hemoglobin A1c  Family history of  leukemia Screening, anemia, deficiency, iron - Plan: CBC  Screening for prostate cancer - Plan: PSA  Nodule of right external ear - Plan: Ambulatory referral to ENT  -Longstanding nodular area by history, but possible slight increased size versus last year.  No apparent surrounding lymphadenopathy.  Will check CBC as above, but refer to ENT for further evaluation, decision on appropriate imaging if needed.  No orders of the defined types were placed in this encounter.  Patient Instructions   Shingles vaccine - check with your pharmacy  - I recommend it if you have not received yet.  Tetanus and flu vaccines given today.  I would check cholesterol, but depending on that reading we can follow-up to discuss coronary calcium scoring in the next few months.  I will refer you to ear nose and throat for the bump under the ear.  Let me know if there are further questions.    Keeping you healthy  Get these tests  Blood pressure- Have your blood pressure checked once a year by your healthcare provider.  Normal blood pressure is 120/80  Weight- Have your body mass index (BMI) calculated to screen for obesity.  BMI is a measure of body fat based on height and weight. You can also calculate your own BMI at [email or web address].  Cholesterol- Have your cholesterol checked every year.  Diabetes- Have your blood sugar checked regularly if you have high blood pressure, high cholesterol, have a family history of diabetes or if you are overweight.  Screening for Colon Cancer- Colonoscopy starting at age 69.  Screening may begin sooner depending on your family history and other health conditions. Follow up colonoscopy as directed by your Gastroenterologist.  Screening for Prostate Cancer- Both blood work (PSA) and a rectal exam help screen for Prostate Cancer.  Screening begins  at age 48 with African-American men and at age 57 with Caucasian men.  Screening may begin sooner depending on your family  history.   Take these medicines  Flu shot- Every fall.  Tetanus- Every 10 years.  Pneumonia shot- Once after the age of 47; if you are younger than 94, ask your healthcare provider if you need a Pneumonia shot.  Take these steps  Don't smoke- If you do smoke, talk to your doctor about quitting.  For tips on how to quit, go to www.smokefree.gov or call 1-800-QUIT-NOW.  Be physically active- Exercise 5 days a week for at least 30 minutes.  If you are not already physically active start slow and gradually work up to 30 minutes of moderate physical activity.  Examples of moderate activity include walking briskly, mowing the yard, dancing, swimming, bicycling, etc.  Eat a healthy diet- Eat a variety of healthy food such as fruits, vegetables, low fat milk, low fat cheese, yogurt, lean meant, poultry, fish, beans, tofu, etc. For more information go to www.thenutritionsource.org  Drink alcohol in moderation- Limit alcohol intake to less than two drinks a day. Never drink and drive.  Dentist- Brush and floss twice daily; visit your dentist twice a year.  Depression- Your emotional health is as important as your physical health. If you're feeling down, or losing interest in things you would normally enjoy please talk to your healthcare provider.  Eye exam- Visit your eye doctor every year.  Safe sex- If you may be exposed to a sexually transmitted infection, use a condom.  Seat belts- Seat belts can save your life; always wear one.  Smoke/Carbon Monoxide detectors- These detectors need to be installed on the appropriate level of your home.  Replace batteries at least once a year.  Skin cancer- When out in the sun, cover up and use sunscreen 15 SPF or higher.  Violence- If anyone is threatening you, please tell your healthcare provider.  Living Will/ Health care power of attorney- Speak with your healthcare provider and family.      If you have lab work done today you will be  contacted with your lab results within the next 2 weeks.  If you have not heard from [place] then please contact us. The fastest way to get your results is to register for My Chart.   IF you received an x-ray today, you will receive an invoice from [facility]. Please contact [facility] at [phone] with questions or concerns regarding your invoice.   IF you received labwork today, you will receive an invoice from [place]. Please contact [facility] at [phone] with questions or concerns regarding your invoice.   Our billing staff will not be able to assist you with questions regarding bills from these companies.  You will be contacted with the lab results as soon as they are available. The fastest way to get your results is to activate your My Chart account. Instructions are located on the last page of this paperwork. If you have not heard from [place] regarding the results in 2 weeks, please contact this office.           Signed, Merri Ray, MD [facility]

## 2020-04-19 NOTE — Patient Instructions (Addendum)
Shingles vaccine - check with your pharmacy  - I recommend it if you have not received yet.  Tetanus and flu vaccines given today.  I would check cholesterol, but depending on that reading we can follow-up to discuss coronary calcium scoring in the next few months.  I will refer you to ear nose and throat for the bump under the ear.  Let me know if there are further questions.    Keeping you healthy  Get these tests  Blood pressure- Have your blood pressure checked once a year by your healthcare provider.  Normal blood pressure is 120/80  Weight- Have your body mass index (BMI) calculated to screen for obesity.  BMI is a measure of body fat based on height and weight. You can also calculate your own BMI at ViewBanking.[email or web address].  Cholesterol- Have your cholesterol checked every year.  Diabetes- Have your blood sugar checked regularly if you have high blood pressure, high cholesterol, have a family history of diabetes or if you are overweight.  Screening for Colon Cancer- Colonoscopy starting at age 44.  Screening may begin sooner depending on your family history and other health conditions. Follow up colonoscopy as directed by your Gastroenterologist.  Screening for Prostate Cancer- Both blood work (PSA) and a rectal exam help screen for Prostate Cancer.  Screening begins at age 80 with African-American men and at age 21 with Caucasian men.  Screening may begin sooner depending on your family history.   Take these medicines  Flu shot- Every fall.  Tetanus- Every 10 years.  Pneumonia shot- Once after the age of 28; if you are younger than 72, ask your healthcare provider if you need a Pneumonia shot.  Take these steps  Don't smoke- If you do smoke, talk to your doctor about quitting.  For tips on how to quit, go to www.smokefree.gov or call 1-800-QUIT-NOW.  Be physically active- Exercise 5 days a week for at least 30 minutes.  If you are not already physically active start slow  and gradually work up to 30 minutes of moderate physical activity.  Examples of moderate activity include walking briskly, mowing the yard, dancing, swimming, bicycling, etc.  Eat a healthy diet- Eat a variety of healthy food such as fruits, vegetables, low fat milk, low fat cheese, yogurt, lean meant, poultry, fish, beans, tofu, etc. For more information go to www.thenutritionsource.org  Drink alcohol in moderation- Limit alcohol intake to less than two drinks a day. Never drink and drive.  Dentist- Brush and floss twice daily; visit your dentist twice a year.  Depression- Your emotional health is as important as your physical health. If you're feeling down, or losing interest in things you would normally enjoy please talk to your healthcare provider.  Eye exam- Visit your eye doctor every year.  Safe sex- If you may be exposed to a sexually transmitted infection, use a condom.  Seat belts- Seat belts can save your life; always wear one.  Smoke/Carbon Monoxide detectors- These detectors need to be installed on the appropriate level of your home.  Replace batteries at least once a year.  Skin cancer- When out in the sun, cover up and use sunscreen 15 SPF or higher.  Violence- If anyone is threatening you, please tell your healthcare provider.  Living Will/ Health care power of attorney- Speak with your healthcare provider and family.      If you have lab work done today you will be contacted with your lab results within the next 2 weeks.  If you have not heard from [place] then please contact us. The fastest way to get your results is to register for My Chart.   IF you received an x-ray today, you will receive an invoice from [facility]. Please contact [facility] at [phone] with questions or concerns regarding your invoice.   IF you received labwork today, you will receive an invoice from [facility]. Please contact [facility] at [phone] with questions or concerns  regarding your invoice.   Our billing staff will not be able to assist you with questions regarding bills from these companies.  You will be contacted with the lab results as soon as they are available. The fastest way to get your results is to activate your My Chart account. Instructions are located on the last page of this paperwork. If you have not heard from [place] regarding the results in 2 weeks, please contact this office.

## 2020-04-20 LAB — HIV ANTIBODY (ROUTINE TESTING W REFLEX): HIV Screen 4th Generation wRfx: NONREACTIVE

## 2020-04-20 LAB — CBC
Hematocrit: 44.6 % (ref 37.5–51.0)
Hemoglobin: 15.3 g/dL (ref 13.0–17.7)
MCH: 29.8 pg (ref 26.6–33.0)
MCHC: 34.3 g/dL (ref 31.5–35.7)
MCV: 87 fL (ref 79–97)
Platelets: 322 10*3/uL (ref 150–450)
RBC: 5.14 x10E6/uL (ref 4.14–5.80)
RDW: 12.6 % (ref 11.6–15.4)

## 2020-04-20 LAB — COMPREHENSIVE METABOLIC PANEL
ALT: 16 IU/L (ref 0–44)
AST: 8 IU/L (ref 0–40)
Albumin/Globulin Ratio: 2 (ref 1.2–2.2)
Albumin: 5.1 g/dL — ABNORMAL HIGH (ref 3.8–4.9)
Alkaline Phosphatase: 53 IU/L (ref 44–121)
BUN/Creatinine Ratio: 19 (ref 9–20)
BUN: 17 mg/dL (ref 6–24)
Bilirubin Total: 0.5 mg/dL (ref 0.0–1.2)
CO2: 21 mmol/L (ref 20–29)
Calcium: 10.2 mg/dL (ref 8.7–10.2)
Creatinine, Ser: 0.91 mg/dL (ref 0.76–1.27)
GFR calc non Af Amer: 92 mL/min/{1.73_m2} (ref >59)
Glucose: 96 mg/dL (ref 65–99)
Potassium: 4.4 mmol/L (ref 3.5–5.2)
Sodium: 137 mmol/L (ref 134–144)
Total Protein: 7.6 g/dL (ref 6.0–8.5)

## 2020-04-20 LAB — HEPATITIS C ANTIBODY: Hep C Virus Ab: <0.1 s/co ratio (ref 0.0–0.9)

## 2020-04-20 LAB — LIPID PANEL
Chol/HDL Ratio: 2.2 ratio (ref 0.0–5.0)
Cholesterol, Total: 244 mg/dL — ABNORMAL HIGH (ref 100–199)
HDL: 113 mg/dL (ref >39)
LDL Chol Calc (NIH): 124 mg/dL — ABNORMAL HIGH (ref 0–99)
Triglycerides: 43 mg/dL (ref 0–149)
VLDL Cholesterol Cal: 7 mg/dL (ref 5–40)

## 2020-04-20 LAB — PSA: Prostate Specific Ag, Serum: 1.1 ng/mL (ref 0.0–4.0)

## 2020-04-20 LAB — HEMOGLOBIN A1C
Est. average glucose Bld gHb Est-mCnc: 120 mg/dL
Hgb A1c MFr Bld: 5.8 % — ABNORMAL HIGH (ref 4.8–5.6)

## 2020-04-26 ENCOUNTER — Other Ambulatory Visit: Payer: Self-pay

## 2020-04-26 ENCOUNTER — Encounter (INDEPENDENT_AMBULATORY_CARE_PROVIDER_SITE_OTHER): Payer: Self-pay | Admitting: Otolaryngology

## 2020-04-26 ENCOUNTER — Ambulatory Visit (INDEPENDENT_AMBULATORY_CARE_PROVIDER_SITE_OTHER): Payer: Managed Care, Other (non HMO) | Admitting: Otolaryngology

## 2020-04-26 VITALS — Temp 97.7°F

## 2020-04-26 DIAGNOSIS — K118 Other diseases of salivary glands: Secondary | ICD-10-CM | POA: Diagnosis not present

## 2020-04-26 NOTE — Progress Notes (Signed)
HPI: Dennis Crawford is a 59 y.o. male who presents is referred by Dr. Ilda Mori evaluation of right ear mass.  Patient has noticed a small bump at the bottom portion of the right ear for over 10 years.  It has gradually gotten larger.  He recently saw his PCP who felt like it enlarged over the past year and referred him here.Marland Kitchen  No past medical history on file. Past Surgical History:  Procedure Laterality Date   FRACTURE SURGERY N/A    Phreesia 04/17/2020   Social History   Socioeconomic History   Marital status: Married    Spouse name: Not on file   Number of children: Not on file   Years of education: Not on file   Highest education level: Not on file  Occupational History   Not on file  Tobacco Use   Smoking status: Never Smoker   Smokeless tobacco: Never Used  Vaping Use   Vaping Use: Never used  Substance and Sexual Activity   Alcohol use: Yes    Alcohol/week: 4.0 standard drinks    Types: 4 Cans of beer per week   Drug use: Never   Sexual activity: Yes  Other Topics Concern   Not on file  Social History Narrative   Not on file   Social Determinants of Health   Financial Resource Strain:    Difficulty of Paying Living Expenses: Not on file  Food Insecurity:    Worried About [place] in the Last Year: Not on file   Ran Out of Food in the Last Year: Not on file  Transportation Needs:    Lack of Transportation (Medical): Not on file   Lack of Transportation (Non-Medical): Not on file  Physical Activity:    Days of Exercise per Week: Not on file   Minutes of Exercise per Session: Not on file  Stress:    Feeling of Stress : Not on file  Social Connections:    Frequency of Communication with Friends and Family: Not on file   Frequency of Social Gatherings with Friends and Family: Not on file   Attends Religious Services: Not on file   Active Member of [place] or Organizations: Not on file   Attends Archivist  Meetings: Not on file   Marital Status: Not on file   Family History  Problem Relation Age of Onset   Cancer Father    Stroke Father    Cancer Maternal Grandfather    Allergies  Allergen Reactions   Penicillins Hives    Pt had severe hives - was told to never take again Has patient had a PCN reaction causing immediate rash, facial/tongue/throat swelling, SOB or lightheadedness with hypotension: No Has patient had a PCN reaction causing severe rash involving mucus membranes or skin necrosis: No Has patient had a PCN reaction that required hospitalization: No Has patient had a PCN reaction occurring within the last 10 years: No If all of the above answers are "NO", then may proceed with Cephalosporin use.    Prior to Admission medications   Not on File     Positive ROS: Otherwise negative  All other systems have been reviewed and were otherwise negative with the exception of those mentioned in the HPI and as above.  Physical Exam: Constitutional: Alert, well-appearing, no acute distress Ears: External ears without lesions or tenderness. Ear canals are clear bilaterally with intact, clear TMs.  Nasal: External nose without lesions. Septum midline. Clear nasal passages Oral: Lips and  gums without lesions. Tongue and palate mucosa without lesions. Posterior oropharynx clear. Neck: No palpable adenopathy or masses. Patient has a subcutaneous nodule just below the right earlobe that on palpation is adjacent to or within the posterior parotid gland. 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 Respiratory: Breathing comfortably  Skin: No facial/neck lesions or rash noted.  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  Plan: Briefly reviewed this with the patient and we will plan on scheduling a CT scan of the neck to better evaluate this. Patient will call us following the CT  scan of the neck.   Radene Journey, MD   CC:

## 2020-04-27 ENCOUNTER — Other Ambulatory Visit (INDEPENDENT_AMBULATORY_CARE_PROVIDER_SITE_OTHER): Payer: Self-pay

## 2020-04-27 DIAGNOSIS — D490 Neoplasm of unspecified behavior of digestive system: Secondary | ICD-10-CM

## 2020-06-30 DIAGNOSIS — D110 Benign neoplasm of parotid gland: Secondary | ICD-10-CM

## 2020-06-30 HISTORY — DX: Benign neoplasm of parotid gland: D11.0

## 2020-07-04 ENCOUNTER — Ambulatory Visit: Payer: Managed Care, Other (non HMO) | Admitting: Family Medicine

## 2020-10-24 ENCOUNTER — Other Ambulatory Visit (INDEPENDENT_AMBULATORY_CARE_PROVIDER_SITE_OTHER): Payer: Managed Care, Other (non HMO)

## 2020-10-24 ENCOUNTER — Telehealth (INDEPENDENT_AMBULATORY_CARE_PROVIDER_SITE_OTHER): Payer: Self-pay

## 2020-10-24 DIAGNOSIS — D37030 Neoplasm of uncertain behavior of the parotid salivary glands: Secondary | ICD-10-CM

## 2020-10-24 NOTE — Telephone Encounter (Signed)
After Ray texted my cell # where he had to send me a copy of his insurance card in 03/2020, he wanted to know about scheduling his ct scan. I called pt from office phone, he had never got ct scan of neck done in 03/2020 and wanted ct scan done. I got new P/A for this and put in new order. I calle dpt to inform him this had been done and asked him to please call office # if he needed anything.

## 2020-11-12 ENCOUNTER — Ambulatory Visit
Admission: RE | Admit: 2020-11-12 | Discharge: 2020-11-12 | Disposition: A | Discharge status: Home / Self Care | Payer: Managed Care, Other (non HMO) | Source: Ambulatory Visit | Attending: Otolaryngology | Admitting: Otolaryngology

## 2020-11-12 ENCOUNTER — Other Ambulatory Visit: Payer: Self-pay

## 2020-11-12 DIAGNOSIS — D37030 Neoplasm of uncertain behavior of the parotid salivary glands: Secondary | ICD-10-CM

## 2020-11-12 MED ORDER — IOPAMIDOL (ISOVUE-300) INJECTION 61%
75.0000 mL | Freq: Once | INTRAVENOUS | Status: AC | PRN
  Administered 2020-11-12: 75 mL via INTRAVENOUS

## 2020-12-03 ENCOUNTER — Telehealth (INDEPENDENT_AMBULATORY_CARE_PROVIDER_SITE_OTHER): Payer: Self-pay | Admitting: Otolaryngology

## 2020-12-03 NOTE — Telephone Encounter (Signed)
Called patient concerning results of the CT scan.  This showed a subtle approximate 2 cm right parotid mass that had characteristics similar of the normal parotid tissue.  After discussion with radiology Dr. Nevada Crane further characterization of the mass can be performed with ultrasound or MRI but diagnosis would require tissue sample. We will plan on scheduling patient for ultrasound and fine-needle aspirate of the right parotid mass.  Discussed this with the patient he is agreeable since he feels like this is gradually enlarged over the past year.

## 2020-12-05 ENCOUNTER — Other Ambulatory Visit (INDEPENDENT_AMBULATORY_CARE_PROVIDER_SITE_OTHER): Payer: Self-pay

## 2020-12-05 ENCOUNTER — Telehealth (INDEPENDENT_AMBULATORY_CARE_PROVIDER_SITE_OTHER): Payer: Self-pay

## 2020-12-05 DIAGNOSIS — D37030 Neoplasm of uncertain behavior of the parotid salivary glands: Secondary | ICD-10-CM

## 2020-12-05 NOTE — Telephone Encounter (Signed)
I called Dennis Crawford to let him know that he will need a [place] of parotid gland before he can get the [place] FNA done. I gave him phone # of [facility] for him to call and get [place] schedule. I will keep eye on it to notify [name] when he gets the US done so they can schedule [place] FNA after.

## 2020-12-06 ENCOUNTER — Encounter (HOSPITAL_COMMUNITY): Payer: Self-pay | Admitting: Radiology

## 2020-12-06 NOTE — Progress Notes (Signed)
   Dennis Enslin "Ray" Male, 60 y.o., 1961/03/05 MRN:  [id number] Phone:  [phone] (M)    RE: [place] FNA SALIVARY GLAND/PAROTID GLAND Received: Today Corrie Mckusick, DO  Garth Bigness D OK for US guided biopsy of left parotid mass.     Image 40 of #3, 11/12/20   Earleen Newport         Previous Messages    ----- Message -----  From: Garth Bigness D  Sent: 12/05/2020   3:20 PM EDT  To: [facility] Procedure Requests  Subject: [place] FNA SALIVARY GLAND/PAROTID GLAND             Procedure:  [place] FNA SALIVARY GLAND/PAROTID GLAND   Reason:  Neoplasm of uncertain behavior of parotid gland, parotid mass   History:   CT in computer   Provider:  Rozetta Nunnery   Provider Contact:  [phone]

## 2020-12-18 ENCOUNTER — Other Ambulatory Visit: Payer: Self-pay | Admitting: Radiology

## 2020-12-20 ENCOUNTER — Ambulatory Visit (HOSPITAL_COMMUNITY)
Admission: RE | Admit: 2020-12-20 | Discharge: 2020-12-20 | Disposition: A | Discharge status: Home / Self Care | Payer: Managed Care, Other (non HMO) | Source: Ambulatory Visit | Attending: Otolaryngology | Admitting: Otolaryngology

## 2020-12-20 ENCOUNTER — Other Ambulatory Visit: Payer: Self-pay

## 2020-12-20 DIAGNOSIS — D37030 Neoplasm of uncertain behavior of the parotid salivary glands: Secondary | ICD-10-CM

## 2020-12-20 DIAGNOSIS — Z809 Family history of malignant neoplasm, unspecified: Secondary | ICD-10-CM | POA: Diagnosis not present

## 2020-12-20 MED ORDER — SODIUM CHLORIDE 0.9 % IV SOLN: INTRAVENOUS | Status: DC

## 2020-12-20 MED ORDER — LIDOCAINE HCL (PF) 1 % IJ SOLN
INTRAMUSCULAR | Status: AC
  Filled 2020-12-20: qty 30

## 2020-12-20 NOTE — Procedures (Signed)
  Procedure: [place] core biopsy R parotid mass   EBL:   minimal Complications:  none immediate  See full dictation in [facility].  Dillard Cannon MD Main # [phone] Pager  [phone]

## 2020-12-20 NOTE — H&P (Signed)
Chief Complaint: Patient was seen in consultation today for parotid gland biopsy  Referring Physician(s): Newman,Christopher E  Supervising Physician: Arne Cleveland  Patient Status: [facility]  History of Present Illness: [facility] is a 60 y.o. male with no significant past medical history. He noticed a gradually enlarging area below his right ear and his PCP sent him to [place] for further evaluation. Imaging was ordered.  CT soft tissue neck with contrast 11/12/20 IMPRESSION: Prominent posterior superficial right parotid gland in the region of palpable concern without definite parotid mass by CT, although streak artifact limits evaluation. If there is a definite palpable lesion, then face MRI would be recommended for more sensitive evaluation of the parotid parenchyma.  Interventional Radiology has been asked to evaluate this patient for an image-guided parotid mass biopsy. This case was reviewed and procedure approved by Dr. Earleen Newport.   No past medical history on file.  Past Surgical History:  Procedure Laterality Date   FRACTURE SURGERY N/A    [employer]reesia 04/17/2020    Allergies: Penicillins  Medications: Prior to Admission medications   Medication Sig Start Date End Date Taking? Authorizing Provider  Ascorbic Acid (VITAMIN C PO) Take 1 tablet by mouth 2 (two) times a week.   Yes [provider]  ibuprofen (ADVIL) 200 MG tablet Take 400 mg by mouth daily as needed for headache or moderate pain.   Yes [provider]  Multiple Vitamin (MULTIVITAMIN WITH MINERALS) TABS tablet Take 1 tablet by mouth 2 (two) times a week.   Yes [provider]     Family History  Problem Relation Age of Onset   Cancer Father    Stroke Father    Cancer Maternal Grandfather     Social History   Socioeconomic History   Marital status: Married    Spouse name: Not on file   Number of children: Not on file   Years of education: Not on  file   Highest education level: Not on file  Occupational History   Not on file  Tobacco Use   Smoking status: Never   Smokeless tobacco: Never  Vaping Use   Vaping Use: Never used  Substance and Sexual Activity   Alcohol use: Yes    Alcohol/week: 4.0 standard drinks    Types: 4 Cans of beer per week   Drug use: Never   Sexual activity: Yes  Other Topics Concern   Not on file  Social History Narrative   Not on file   Social Determinants of Health   Financial Resource Strain: Not on file  Food Insecurity: Not on file  Transportation Needs: Not on file  Physical Activity: Not on file  Stress: Not on file  Social Connections: Not on file    Review of Systems: A 12 point ROS discussed and pertinent positives are indicated in the HPI above.  All other systems are negative.  Vital Signs: BP (!) 142/91 (BP Location: Right Arm)   Pulse 60   Temp 98.2 F (36.8 C) (Oral)   Resp 18   Ht 5\' 9"  (1.753 m)   Wt 167 lb (75.8 kg)   SpO2 100%   BMI 24.66 kg/m   Imaging: No results found.  Labs:  CBC: Recent Labs    04/19/20 1114  WBC 7.1  HGB 15.3  HCT 44.6  PLT 322    COAGS: No results for input(s): INR, APTT in the last 8760 hours.  BMP: Recent Labs    04/19/20 1114  NA 137  K 4.4  CL 98  CO2 21  GLUCOSE 96  BUN 17  CALCIUM 10.2  CREATININE 0.91  GFRNONAA 92  GFRAA 106    LIVER FUNCTION TESTS: Recent Labs    04/19/20 1114  BILITOT 0.5  AST 8  ALT 16  ALKPHOS 53  PROT 7.6  ALBUMIN 5.1*    TUMOR MARKERS: No results for input(s): AFPTM, CEA, CA199, CHROMGRNA in the last 8760 hours.  Assessment and Plan:  Parotid mass: Dennis [facility], 60 year old male, presents today to the [facility] department for an image-guided parotid mass biopsy. The patient has elected to have this procedure done with local anesthesia only.  Risks and benefits of this procedure was discussed with the patient and/or patient's family  including, but not limited to bleeding, infection, damage to adjacent structures or low yield requiring additional tests.  All of the questions were answered and there is agreement to proceed.  Consent signed and in chart.  Thank you for this interesting consult.  I greatly enjoyed meeting Dennis [facility] and look forward to participating in their care.  A copy of this report was sent to the requesting provider on this date.  Electronically Signed: Soyla Dryer, AGACNP-BC [phone] 12/20/2020, 1:31 PM   I spent a total of  30 Minutes   in face to face in clinical consultation, greater than 50% of which was counseling/coordinating care for parotid mass biopsy

## 2020-12-21 LAB — SURGICAL PATHOLOGY

## 2021-01-04 ENCOUNTER — Telehealth: Payer: Self-pay

## 2021-01-04 DIAGNOSIS — U071 COVID-19: Secondary | ICD-10-CM

## 2021-01-04 MED ORDER — NIRMATRELVIR/RITONAVIR (PAXLOVID)TABLET: 3.0000 | ORAL_TABLET | Freq: Two times a day (BID) | ORAL | 0 refills | Status: AC

## 2021-01-04 NOTE — Telephone Encounter (Signed)
Tested positive for covid has some congestion, run a low grade fever, minor headache, sore throat is there advice of what he can do or take to help with this?   Pt call back [phone]

## 2021-01-04 NOTE — Telephone Encounter (Signed)
Called pt.  Initial sx's yesterday early morning. Hoarse, cough, slight myalgias. Slight fever -100.2, 100.4.  Positive test for covid 19 today.  Covid vaccine in 08/2019, single booster last fall.  No dyspnea, confusion, or difficulty with drinking fluids. Eating and drinking ok.  Tx: advil.  Symptomatic care discussed with fluids, Mucinex, antipyretics.  Isolation and masking precautions discussed including timing.  Antivirals discussed and he would like to try Paxlovid.  Potential side effects discussed.  GFR normal.  No prescription meds.  Prescription sent to [facility].  ER precautions discussed.  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

## 2021-01-07 ENCOUNTER — Telehealth (INDEPENDENT_AMBULATORY_CARE_PROVIDER_SITE_OTHER): Payer: Self-pay | Admitting: Otolaryngology

## 2021-01-07 NOTE — Telephone Encounter (Signed)
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 I discussed with him that this is a benign tumor but would recommend removing this as this will gradually enlarge.  Discussed with him that the risk involved with surgery would be dissection around the facial nerve. I reviewed with him that if he elects to have this removed I would like to see him in the office prior to surgery.

## 2021-04-22 HISTORY — PX: TUMOR EXCISION: SHX421

## 2021-08-26 ENCOUNTER — Encounter: Payer: Self-pay | Admitting: Family Medicine

## 2021-08-26 ENCOUNTER — Ambulatory Visit (INDEPENDENT_AMBULATORY_CARE_PROVIDER_SITE_OTHER): Payer: Managed Care, Other (non HMO) | Admitting: Family Medicine

## 2021-08-26 VITALS — BP 118/74 | HR 53 | Temp 97.8°F | Resp 17 | Ht 69.0 in | Wt 169.2 lb

## 2021-08-26 DIAGNOSIS — E785 Hyperlipidemia, unspecified: Secondary | ICD-10-CM

## 2021-08-26 DIAGNOSIS — Z0001 Encounter for general adult medical examination with abnormal findings: Secondary | ICD-10-CM | POA: Diagnosis not present

## 2021-08-26 DIAGNOSIS — Z0000 Encounter for general adult medical examination without abnormal findings: Secondary | ICD-10-CM | POA: Diagnosis not present

## 2021-08-26 DIAGNOSIS — Z131 Encounter for screening for diabetes mellitus: Secondary | ICD-10-CM | POA: Diagnosis not present

## 2021-08-26 DIAGNOSIS — Z130 Encounter for screening for diseases of the blood and blood-forming organs and certain disorders involving the immune mechanism: Secondary | ICD-10-CM | POA: Diagnosis not present

## 2021-08-26 DIAGNOSIS — Z1211 Encounter for screening for malignant neoplasm of colon: Secondary | ICD-10-CM

## 2021-08-26 DIAGNOSIS — Z23 Encounter for immunization: Secondary | ICD-10-CM

## 2021-08-26 DIAGNOSIS — R12 Heartburn: Secondary | ICD-10-CM | POA: Diagnosis not present

## 2021-08-26 DIAGNOSIS — Z125 Encounter for screening for malignant neoplasm of prostate: Secondary | ICD-10-CM | POA: Diagnosis not present

## 2021-08-26 LAB — COMPREHENSIVE METABOLIC PANEL
ALT: 17 U/L (ref 0–53)
AST: 10 U/L (ref 0–37)
Albumin: 4.8 g/dL (ref 3.5–5.2)
Alkaline Phosphatase: 53 U/L (ref 39–117)
BUN: 15 mg/dL (ref 6–23)
CO2: 29 mEq/L (ref 19–32)
Calcium: 10 mg/dL (ref 8.4–10.5)
Chloride: 100 mEq/L (ref 96–112)
Creatinine, Ser: 0.97 mg/dL (ref 0.40–1.50)
GFR: 84.92 mL/min (ref >60.00)
Glucose, Bld: 96 mg/dL (ref 70–99)
Potassium: 4 mEq/L (ref 3.5–5.1)
Sodium: 137 mEq/L (ref 135–145)
Total Bilirubin: 0.8 mg/dL (ref 0.2–1.2)
Total Protein: 7.5 g/dL (ref 6.0–8.3)

## 2021-08-26 LAB — CBC WITH DIFFERENTIAL/PLATELET
Basophils Absolute: 0 10*3/uL (ref 0.0–0.1)
Basophils Relative: 0.8 % (ref 0.0–3.0)
Eosinophils Absolute: 0.1 10*3/uL (ref 0.0–0.7)
Eosinophils Relative: 2.2 % (ref 0.0–5.0)
HCT: 44.5 % (ref 39.0–52.0)
Hemoglobin: 14.8 g/dL (ref 13.0–17.0)
Lymphocytes Relative: 32.4 % (ref 12.0–46.0)
Lymphs Abs: 1.8 10*3/uL (ref 0.7–4.0)
MCHC: 33.2 g/dL (ref 30.0–36.0)
MCV: 89.6 fl (ref 78.0–100.0)
Monocytes Absolute: 0.5 10*3/uL (ref 0.1–1.0)
Monocytes Relative: 8.4 % (ref 3.0–12.0)
Neutro Abs: 3.2 10*3/uL (ref 1.4–7.7)
Neutrophils Relative %: 56.2 % (ref 43.0–77.0)
Platelets: 286 10*3/uL (ref 150.0–400.0)
RBC: 4.97 Mil/uL (ref 4.22–5.81)
RDW: 13.6 % (ref 11.5–15.5)
WBC: 5.6 10*3/uL (ref 4.0–10.5)

## 2021-08-26 LAB — LIPID PANEL
Cholesterol: 227 mg/dL — ABNORMAL HIGH (ref 0–200)
HDL: 95.5 mg/dL (ref >39.00)
LDL Cholesterol: 122 mg/dL — ABNORMAL HIGH (ref 0–99)
NonHDL: 131.24
Total CHOL/HDL Ratio: 2
Triglycerides: 44 mg/dL (ref 0.0–149.0)
VLDL: 8.8 mg/dL (ref 0.0–40.0)

## 2021-08-26 LAB — HEMOGLOBIN A1C: Hgb A1c MFr Bld: 5.6 % (ref 4.6–6.5)

## 2021-08-26 MED ORDER — OMEPRAZOLE 20 MG PO CPDR: 20.0000 mg | DELAYED_RELEASE_CAPSULE | Freq: Every day | ORAL | 1 refills | Status: DC

## 2021-08-26 NOTE — Patient Instructions (Addendum)
Try omeprazole daily, see other foods to avoid below.  I would consider meeting with gastroenterology if symptoms persist to look into other causes, including something called achalasia.  Recheck in  4 weeks. Can decide next step then.   I will check cholesterol and ordered the coronary calcium test.   Food Choices for Gastroesophageal Reflux Disease, Adult When you have gastroesophageal reflux disease (GERD), the foods you eat and your eating habits are very important. Choosing the right foods can help ease the discomfort of GERD. Consider working with a dietitian to help you make healthy food choices. What are tips for following this plan? Reading food labels Look for foods that are low in saturated fat. Foods that have less than 5% of daily value (DV) of fat and 0 g of trans fats may help with your symptoms. Cooking Cook foods using methods other than frying. This may include baking, steaming, grilling, or broiling. These are all methods that do not need a lot of fat for cooking. To add flavor, try to use herbs that are low in spice and acidity. Meal planning  Choose healthy foods that are low in fat, such as fruits, vegetables, whole grains, low-fat dairy products, lean meats, fish, and poultry. Eat frequent, small meals instead of three large meals each day. Eat your meals slowly, in a relaxed setting. Avoid bending over or lying down until 2-3 hours after eating. Limit high-fat foods such as fatty meats or fried foods. Limit your intake of fatty foods, such as oils, butter, and shortening. Avoid the following as told by your health care provider: Foods that cause symptoms. These may be different for different people. Keep a food diary to keep track of foods that cause symptoms. Alcohol. Drinking large amounts of liquid with meals. Eating meals during the 2-3 hours before bed. Lifestyle Maintain a healthy weight. Ask your health care provider what weight is healthy for you. If you need  to lose weight, work with your health care provider to do so safely. Exercise for at least 30 minutes on 5 or more days each week, or as told by your health care provider. Avoid wearing clothes that fit tightly around your waist and chest. Do not use any products that contain nicotine or tobacco. These products include cigarettes, chewing tobacco, and vaping devices, such as e-cigarettes. If you need help quitting, ask your health care provider. Sleep with the head of your bed raised. Use a wedge under the mattress or blocks under the bed frame to raise the head of the bed. Chew sugar-free gum after mealtimes. What foods should I eat? Eat a healthy, well-balanced diet of fruits, vegetables, whole grains, low-fat dairy products, lean meats, fish, and poultry. Each person is different. Foods that may trigger symptoms in one person may not trigger any symptoms in another person. Work with your health care provider to identify foods that are safe for you. The items listed above may not be a complete list of recommended foods and beverages. Contact a dietitian for more information. What foods should I avoid? Limiting some of these foods may help manage the symptoms of GERD. Everyone is different. Consult a dietitian or your health care provider to help you identify the exact foods to avoid, if any. Fruits Any fruits prepared with added fat. Any fruits that cause symptoms. For some people this may include citrus fruits, such as oranges, grapefruit, pineapple, and lemons. Vegetables Deep-fried vegetables. [place] fries. Any vegetables prepared with added fat. Any vegetables that  cause symptoms. For some people, this may include tomatoes and tomato products, chili peppers, onions and garlic, and horseradish. Grains Pastries or quick breads with added fat. Meats and other proteins High-fat meats, such as fatty beef or pork, hot dogs, ribs, ham, sausage, salami, and bacon. Fried meat or protein, including  fried fish and fried chicken. Nuts and nut butters, in large amounts. Dairy Whole milk and chocolate milk. Sour cream. Cream. Ice cream. Cream cheese. Milkshakes. Fats and oils Butter. Margarine. Shortening. Ghee. Beverages Coffee and tea, with or without caffeine. Carbonated beverages. Sodas. Energy drinks. Fruit juice made with acidic fruits, such as orange or grapefruit. Tomato juice. Alcoholic drinks. Sweets and desserts Chocolate and cocoa. Donuts. Seasonings and condiments Pepper. Peppermint and spearmint. Added salt. Any condiments, herbs, or seasonings that cause symptoms. For some people, this may include curry, hot sauce, or vinegar-based salad dressings. The items listed above may not be a complete list of foods and beverages to avoid. Contact a dietitian for more information. Questions to ask your health care provider Diet and lifestyle changes are usually the first steps that are taken to manage symptoms of GERD. If diet and lifestyle changes do not improve your symptoms, talk with your health care provider about taking medicines. Where to find more information International Foundation for Gastrointestinal Disorders: aboutgerd.org Summary When you have gastroesophageal reflux disease (GERD), food and lifestyle choices may be very helpful in easing the discomfort of GERD. Eat frequent, small meals instead of three large meals each day. Eat your meals slowly, in a relaxed setting. Avoid bending over or lying down until 2-3 hours after eating. Limit high-fat foods such as fatty meats or fried foods. This information is not intended to replace advice given to you by your health care provider. Make sure you discuss any questions you have with your health care provider. Document Revised: 12/26/2019 Document Reviewed: 12/26/2019 Elsevier Patient Education  2022 Elsevier Inc.  Preventive Care 7-39 Years Old, Male Preventive care refers to lifestyle choices and visits with your health  care provider that can promote health and wellness. Preventive care visits are also called wellness exams. What can I expect for my preventive care visit? Counseling During your preventive care visit, your health care provider may ask about your: Medical history, including: Past medical problems. Family medical history. Current health, including: Emotional well-being. Home life and relationship well-being. Sexual activity. Lifestyle, including: Alcohol, nicotine or tobacco, and drug use. Access to firearms. Diet, exercise, and sleep habits. Safety issues such as seatbelt and bike helmet use. Sunscreen use. Work and work Statistician. Physical exam Your health care provider will check your: Height and weight. These may be used to calculate your BMI (body mass index). BMI is a measurement that tells if you are at a healthy weight. Waist circumference. This measures the distance around your waistline. This measurement also tells if you are at a healthy weight and may help predict your risk of certain diseases, such as type 2 diabetes and high blood pressure. Heart rate and blood pressure. Body temperature. Skin for abnormal spots. What immunizations do I need? Vaccines are usually given at various ages, according to a schedule. Your health care provider will recommend vaccines for you based on your age, medical history, and lifestyle or other factors, such as travel or where you work. What tests do I need? Screening Your health care provider may recommend screening tests for certain conditions. This may include: Lipid and cholesterol levels. Diabetes screening. This is done by  checking your blood sugar (glucose) after you have not eaten for a while (fasting). Hepatitis B test. Hepatitis C test. HIV (human immunodeficiency virus) test. STI (sexually transmitted infection) testing, if you are at risk. Lung cancer screening. Prostate cancer screening. Colorectal cancer screening. Talk  with your health care provider about your test results, treatment options, and if necessary, the need for more tests. Follow these instructions at home: Eating and drinking  Eat a diet that includes fresh fruits and vegetables, whole grains, lean protein, and low-fat dairy products. Take vitamin and mineral supplements as recommended by your health care provider. Do not drink alcohol if your health care provider tells you not to drink. If you drink alcohol: Limit how much you have to 0-2 drinks a day. Know how much alcohol is in your drink. In the [place] one drink equals one 12 oz bottle of beer (355 mL), one 5 oz glass of wine (148 mL), or one 1 oz glass of hard liquor (44 mL). Lifestyle Brush your teeth every morning and night with fluoride toothpaste. Floss one time each day. Exercise for at least 30 minutes 5 or more days each week. Do not use any products that contain nicotine or tobacco. These products include cigarettes, chewing tobacco, and vaping devices, such as e-cigarettes. If you need help quitting, ask your health care provider. Do not use drugs. If you are sexually active, practice safe sex. Use a condom or other form of protection to prevent STIs. Take aspirin only as told by your health care provider. Make sure that you understand how much to take and what form to take. Work with your health care provider to find out whether it is safe and beneficial for you to take aspirin daily. Find healthy ways to manage stress, such as: Meditation, yoga, or listening to music. Journaling. Talking to a trusted person. Spending time with friends and family. Minimize exposure to UV radiation to reduce your risk of skin cancer. Safety Always wear your seat belt while driving or riding in a vehicle. Do not drive: If you have been drinking alcohol. Do not ride with someone who has been drinking. When you are tired or distracted. While texting. If you have been using any mind-altering  substances or drugs. Wear a helmet and other protective equipment during sports activities. If you have firearms in your house, make sure you follow all gun safety procedures. What's next? Go to your health care provider once a year for an annual wellness visit. Ask your health care provider how often you should have your eyes and teeth checked. Stay up to date on all vaccines. This information is not intended to replace advice given to you by your health care provider. Make sure you discuss any questions you have with your health care provider. Document Revised: 12/12/2020 Document Reviewed: 12/12/2020 Elsevier Patient Education  [place].

## 2021-08-26 NOTE — Progress Notes (Signed)
Subjective:  Patient ID: Dennis Crawford, male    DOB: 1960-09-02  Age: 61 y.o. MRN: [id number]  CC:  Chief Complaint  Patient presents with   Annual Exam    Pt would like recommendation on reflux treatment, very intermittent over last several years. Pt is wondering if he should have a calcium scoring test due to family history, reports can return to discuss this but  wasn't sure if this was valid concern  Pt is fasting for labs     HPI Dennis Crawford presents for  Annual exam, other concerns above  Heartburn: Longstanding. Intermittent. Last episode that was significant was few months ago. Had to get up and walk around. Felt like unable to drink water or food or will get stuck. Episode in past in 04/2020 - had to regurgitate. Feel like food coming back up, burning sensation. Random occurrences.  Mild sx's few times per week - burning sensation, has to slow down eating, drinks water, passes ok.  Higher fat foods may be the trigger, but not everytime.  No abd pain. No fever/night sweats/weight loss.  Tx: no meds. Caffeine: 1 cup coffee per day.   Hyperlipidemia No FH of early cardiac disease. Brother with elevated CCS. He is on statin.  No recent lipids.  Elevated in 2021.  No current meds.  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 Dr. Redmond Baseman. Temporary facial numbness has resolved. .   Lab Results  Component Value Date   CHOL 244 (H) 04/19/2020   HDL 113 04/19/2020   LDLCALC 124 (H) 04/19/2020   TRIG 43 04/19/2020   CHOLHDL 2.2 04/19/2020  The ASCVD Risk score (Arnett DK, et al., 2019) failed to calculate for the following reasons:   The valid HDL cholesterol range is 20 to 100 mg/dL  Depression screen [facility] 2/9 08/26/2021 04/19/2020 10/16/2017  Decreased Interest 0 0 0  Down, Depressed, Hopeless 0 0 0  PHQ - 2 Score 0 0 0    Cancer screening Colonoscopy at age 10 normal.  Screening options with colonoscopy versus Cologuard discussed.  Discussed timing of repeat testing intervals if normal, as well as potential need for diagnostic Colonoscopy if positive Cologuard. Understanding expressed, and chose Colonoscopy.   No FH of  The natural history of prostate cancer and ongoing controversy regarding screening and potential treatment outcomes of prostate cancer has been discussed with the patient. The meaning of a false positive PSA and a false negative PSA has been discussed. He indicates understanding of the limitations of this screening test and wishes to proceed with screening PSA testing.  Lab Results  Component Value Date   PSA1 1.1 04/19/2020   PSA1 0.8 10/16/2017   Immunization History  Administered Date(s) Administered   Influenza,inj,Quad PF,6+ Mos 04/19/2020   PFIZER(Purple Top)SARS-COV-2 Vaccination 09/03/2019, 09/24/2019   Tdap 04/19/2020   Zoster Recombinat (Shingrix) 08/26/2021  Shingrix today.  Covid booster - November.  Flu vaccine last fall.   No results found. Saw optho in September - doing well. Wears glasses.   Dentist every 6 months.   Alcohol 4drinks per week, dry January.   Exercise: F3, 5-6 days per week, 31min -1 hr.   History Patient Active Problem List   Diagnosis Date Noted   Pneumothorax 02/01/2018   Pneumothorax on right 01/31/2018   History reviewed. No pertinent past medical history. Past Surgical History:  Procedure Laterality Date   FRACTURE SURGERY N/A    [employer] 04/17/2020   TUMOR EXCISION Right  04/22/2021   pt notes benign tumor removal   Allergies  Allergen Reactions   Penicillins Hives    Pt had severe hives - was told to never take again Has patient had a PCN reaction causing immediate rash, facial/tongue/throat swelling, SOB or lightheadedness with hypotension: No Has patient had a PCN reaction causing severe rash involving mucus membranes or skin necrosis: No Has patient had a PCN reaction that required hospitalization: No Has patient had a PCN reaction  occurring within the last 10 years: No If all of the above answers are "NO", then may proceed with Cephalosporin use.    Prior to Admission medications   Medication Sig Start Date End Date Taking? Authorizing Provider  Ascorbic Acid (VITAMIN C PO) Take 1 tablet by mouth 2 (two) times a week.   Yes [provider]  ibuprofen (ADVIL) 200 MG tablet Take 400 mg by mouth daily as needed for headache or moderate pain.   Yes [provider]  Multiple Vitamin (MULTIVITAMIN WITH MINERALS) TABS tablet Take 1 tablet by mouth 2 (two) times a week.   Yes [provider]   Social History   Socioeconomic History   Marital status: Married    Spouse name: Not on file   Number of children: Not on file   Years of education: Not on file   Highest education level: Not on file  Occupational History   Not on file  Tobacco Use   Smoking status: Never   Smokeless tobacco: Never  Vaping Use   Vaping Use: Never used  Substance and Sexual Activity   Alcohol use: Yes    Alcohol/week: 3.0 standard drinks    Types: 3 Cans of beer per week   Drug use: Never   Sexual activity: Yes  Other Topics Concern   Not on file  Social History Narrative   Not on file   Social Determinants of Health   Financial Resource Strain: Not on file  Food Insecurity: Not on file  Transportation Needs: Not on file  Physical Activity: Not on file  Stress: Not on file  Social Connections: Not on file  Intimate Partner Violence: Not on file    Review of Systems  13 point review of systems per patient health survey noted.  Negative other than as indicated above or in HPI.   Objective:   Vitals:   08/26/21 1122  BP: 118/74  Pulse: (!) 53  Resp: 17  Temp: 97.8 F (36.6 C)  TempSrc: Temporal  SpO2: 100%  Weight: 169 lb 3.2 oz (76.7 kg)  Height: 5\' 9"  (1.753 m)     Physical Exam Vitals reviewed.  Constitutional:      Appearance: He is well-developed.  HENT:     Head: Normocephalic  and atraumatic.  Neck:     Vascular: No carotid bruit or JVD.  Cardiovascular:     Rate and Rhythm: Normal rate and regular rhythm.     Heart sounds: Normal heart sounds. No murmur heard. Pulmonary:     Effort: Pulmonary effort is normal.     Breath sounds: Normal breath sounds. No rales.  Abdominal:     General: There is no distension.     Tenderness: There is no abdominal tenderness. There is no guarding.     Comments: Min sensation RUQ not painful   Musculoskeletal:     Right lower leg: No edema.     Left lower leg: No edema.  Skin:    General: Skin is warm and  dry.  Neurological:     Mental Status: He is alert and oriented to person, place, and time.  Psychiatric:        Mood and Affect: Mood normal.       Assessment & Plan:  Dennis Crawford is a 61 y.o. male . Annual physical exam - Plan: CBC with Differential/Platelet, Comprehensive metabolic panel, Lipid panel, Hemoglobin A1c, PSA  - -anticipatory guidance as below in AVS, screening labs above. Health maintenance items as above in HPI discussed/recommended as applicable.   Screening for diabetes mellitus - Plan: Hemoglobin A1c  Hyperlipidemia, unspecified hyperlipidemia type - Plan: Lipid panel, CT CARDIAC SCORING (SELF PAY ONLY)  - check lipids, CCS to assist with decision on statin - pt requests CCS as brother's elevated.   Screening, anemia, deficiency, iron - Plan: CBC with Differential/Platelet  Screening for prostate cancer - Plan: PSA  Screening for colon cancer - Plan: Ambulatory referral to Gastroenterology  Need for shingles vaccine - Plan: Varicella-zoster vaccine IM  Heartburn - Plan: omeprazole (PRILOSEC) 20 MG capsule  - GERD vs achalasia. Trigger avoidance, handout given. Start PPI, recheck next few weeks to decide on GI eval necessity. RTC precautions if worse sooner.   Meds ordered this encounter  Medications   omeprazole (PRILOSEC) 20 MG capsule    Sig: Take 1 capsule (20 mg total) by mouth  daily.    Dispense:  30 capsule    Refill:  1   Patient Instructions  Try omeprazole daily, see other foods to avoid below.  I would consider meeting with gastroenterology if symptoms persist to look into other causes, including something called achalasia.  Recheck in  4 weeks. Can decide next step then.   I will check cholesterol and ordered the coronary calcium test.   Food Choices for Gastroesophageal Reflux Disease, Adult When you have gastroesophageal reflux disease (GERD), the foods you eat and your eating habits are very important. Choosing the right foods can help ease the discomfort of GERD. Consider working with a dietitian to help you make healthy food choices. What are tips for following this plan? Reading food labels Look for foods that are low in saturated fat. Foods that have less than 5% of daily value (DV) of fat and 0 g of trans fats may help with your symptoms. Cooking Cook foods using methods other than frying. This may include baking, steaming, grilling, or broiling. These are all methods that do not need a lot of fat for cooking. To add flavor, try to use herbs that are low in spice and acidity. Meal planning  Choose healthy foods that are low in fat, such as fruits, vegetables, whole grains, low-fat dairy products, lean meats, fish, and poultry. Eat frequent, small meals instead of three large meals each day. Eat your meals slowly, in a relaxed setting. Avoid bending over or lying down until 2-3 hours after eating. Limit high-fat foods such as fatty meats or fried foods. Limit your intake of fatty foods, such as oils, butter, and shortening. Avoid the following as told by your health care provider: Foods that cause symptoms. These may be different for different people. Keep a food diary to keep track of foods that cause symptoms. Alcohol. Drinking large amounts of liquid with meals. Eating meals during the 2-3 hours before bed. Lifestyle Maintain a healthy  weight. Ask your health care provider what weight is healthy for you. If you need to lose weight, work with your health care provider to do so safely. Exercise  for at least 30 minutes on 5 or more days each week, or as told by your health care provider. Avoid wearing clothes that fit tightly around your waist and chest. Do not use any products that contain nicotine or tobacco. These products include cigarettes, chewing tobacco, and vaping devices, such as e-cigarettes. If you need help quitting, ask your health care provider. Sleep with the head of your bed raised. Use a wedge under the mattress or blocks under the bed frame to raise the head of the bed. Chew sugar-free gum after mealtimes. What foods should I eat? Eat a healthy, well-balanced diet of fruits, vegetables, whole grains, low-fat dairy products, lean meats, fish, and poultry. Each person is different. Foods that may trigger symptoms in one person may not trigger any symptoms in another person. Work with your health care provider to identify foods that are safe for you. The items listed above may not be a complete list of recommended foods and beverages. Contact a dietitian for more information. What foods should I avoid? Limiting some of these foods may help manage the symptoms of GERD. Everyone is different. Consult a dietitian or your health care provider to help you identify the exact foods to avoid, if any. Fruits Any fruits prepared with added fat. Any fruits that cause symptoms. For some people this may include citrus fruits, such as oranges, grapefruit, pineapple, and lemons. Vegetables Deep-fried vegetables. [place] fries. Any vegetables prepared with added fat. Any vegetables that cause symptoms. For some people, this may include tomatoes and tomato products, chili peppers, onions and garlic, and horseradish. Grains Pastries or quick breads with added fat. Meats and other proteins High-fat meats, such as fatty beef or pork, hot  dogs, ribs, ham, sausage, salami, and bacon. Fried meat or protein, including fried fish and fried chicken. Nuts and nut butters, in large amounts. Dairy Whole milk and chocolate milk. Sour cream. Cream. Ice cream. Cream cheese. Milkshakes. Fats and oils Butter. Margarine. Shortening. Ghee. Beverages Coffee and tea, with or without caffeine. Carbonated beverages. Sodas. Energy drinks. Fruit juice made with acidic fruits, such as orange or grapefruit. Tomato juice. Alcoholic drinks. Sweets and desserts Chocolate and cocoa. Donuts. Seasonings and condiments Pepper. Peppermint and spearmint. Added salt. Any condiments, herbs, or seasonings that cause symptoms. For some people, this may include curry, hot sauce, or vinegar-based salad dressings. The items listed above may not be a complete list of foods and beverages to avoid. Contact a dietitian for more information. Questions to ask your health care provider Diet and lifestyle changes are usually the first steps that are taken to manage symptoms of GERD. If diet and lifestyle changes do not improve your symptoms, talk with your health care provider about taking medicines. Where to find more information International Foundation for Gastrointestinal Disorders: aboutgerd.org Summary When you have gastroesophageal reflux disease (GERD), food and lifestyle choices may be very helpful in easing the discomfort of GERD. Eat frequent, small meals instead of three large meals each day. Eat your meals slowly, in a relaxed setting. Avoid bending over or lying down until 2-3 hours after eating. Limit high-fat foods such as fatty meats or fried foods. This information is not intended to replace advice given to you by your health care provider. Make sure you discuss any questions you have with your health care provider. Document Revised: 12/26/2019 Document Reviewed: 12/26/2019 Elsevier Patient Education  2022 Elsevier Inc.  Preventive Care 73-55 Years Old,  Male Preventive care refers to lifestyle choices and visits  with your health care provider that can promote health and wellness. Preventive care visits are also called wellness exams. What can I expect for my preventive care visit? Counseling During your preventive care visit, your health care provider may ask about your: Medical history, including: Past medical problems. Family medical history. Current health, including: Emotional well-being. Home life and relationship well-being. Sexual activity. Lifestyle, including: Alcohol, nicotine or tobacco, and drug use. Access to firearms. Diet, exercise, and sleep habits. Safety issues such as seatbelt and bike helmet use. Sunscreen use. Work and work Statistician. Physical exam Your health care provider will check your: Height and weight. These may be used to calculate your BMI (body mass index). BMI is a measurement that tells if you are at a healthy weight. Waist circumference. This measures the distance around your waistline. This measurement also tells if you are at a healthy weight and may help predict your risk of certain diseases, such as type 2 diabetes and high blood pressure. Heart rate and blood pressure. Body temperature. Skin for abnormal spots. What immunizations do I need? Vaccines are usually given at various ages, according to a schedule. Your health care provider will recommend vaccines for you based on your age, medical history, and lifestyle or other factors, such as travel or where you work. What tests do I need? Screening Your health care provider may recommend screening tests for certain conditions. This may include: Lipid and cholesterol levels. Diabetes screening. This is done by checking your blood sugar (glucose) after you have not eaten for a while (fasting). Hepatitis B test. Hepatitis C test. HIV (human immunodeficiency virus) test. STI (sexually transmitted infection) testing, if you are at risk. Lung  cancer screening. Prostate cancer screening. Colorectal cancer screening. Talk with your health care provider about your test results, treatment options, and if necessary, the need for more tests. Follow these instructions at home: Eating and drinking  Eat a diet that includes fresh fruits and vegetables, whole grains, lean protein, and low-fat dairy products. Take vitamin and mineral supplements as recommended by your health care provider. Do not drink alcohol if your health care provider tells you not to drink. If you drink alcohol: Limit how much you have to 0-2 drinks a day. Know how much alcohol is in your drink. In the [place] one drink equals one 12 oz bottle of beer (355 mL), one 5 oz glass of wine (148 mL), or one 1 oz glass of hard liquor (44 mL). Lifestyle Brush your teeth every morning and night with fluoride toothpaste. Floss one time each day. Exercise for at least 30 minutes 5 or more days each week. Do not use any products that contain nicotine or tobacco. These products include cigarettes, chewing tobacco, and vaping devices, such as e-cigarettes. If you need help quitting, ask your health care provider. Do not use drugs. If you are sexually active, practice safe sex. Use a condom or other form of protection to prevent STIs. Take aspirin only as told by your health care provider. Make sure that you understand how much to take and what form to take. Work with your health care provider to find out whether it is safe and beneficial for you to take aspirin daily. Find healthy ways to manage stress, such as: Meditation, yoga, or listening to music. Journaling. Talking to a trusted person. Spending time with friends and family. Minimize exposure to UV radiation to reduce your risk of skin cancer. Safety Always wear your seat belt while driving or riding  in a vehicle. Do not drive: If you have been drinking alcohol. Do not ride with someone who has been drinking. When you are  tired or distracted. While texting. If you have been using any mind-altering substances or drugs. Wear a helmet and other protective equipment during sports activities. If you have firearms in your house, make sure you follow all gun safety procedures. What's next? Go to your health care provider once a year for an annual wellness visit. Ask your health care provider how often you should have your eyes and teeth checked. Stay up to date on all vaccines. This information is not intended to replace advice given to you by your health care provider. Make sure you discuss any questions you have with your health care provider. Document Revised: 12/12/2020 Document Reviewed: 12/12/2020 Elsevier Patient Education  2022 [place],   Merri Ray, MD [place] 08/26/21 12:51 PM

## 2021-08-27 LAB — PSA: PSA: 0.76 ng/mL (ref 0.10–4.00)

## 2021-09-23 ENCOUNTER — Ambulatory Visit: Payer: Managed Care, Other (non HMO) | Admitting: Family Medicine

## 2021-10-07 ENCOUNTER — Ambulatory Visit (INDEPENDENT_AMBULATORY_CARE_PROVIDER_SITE_OTHER)
Admission: RE | Admit: 2021-10-07 | Discharge: 2021-10-07 | Disposition: A | Discharge status: Home / Self Care | Payer: Self-pay | Source: Ambulatory Visit | Attending: Family Medicine | Admitting: Family Medicine

## 2021-10-07 DIAGNOSIS — E785 Hyperlipidemia, unspecified: Secondary | ICD-10-CM

## 2021-10-10 ENCOUNTER — Encounter: Payer: Self-pay | Admitting: Family Medicine

## 2021-10-10 DIAGNOSIS — E785 Hyperlipidemia, unspecified: Secondary | ICD-10-CM

## 2021-10-10 DIAGNOSIS — Z1211 Encounter for screening for malignant neoplasm of colon: Secondary | ICD-10-CM

## 2021-10-10 MED ORDER — ATORVASTATIN CALCIUM 10 MG PO TABS: 10.0000 mg | ORAL_TABLET | Freq: Every day | ORAL | 0 refills | Status: DC

## 2021-10-10 NOTE — Telephone Encounter (Signed)
Pt has agreed to statin therapy but has asked for mor information on what his scores mean and how a statin helps reduce his risk  ?

## 2021-10-11 NOTE — Telephone Encounter (Signed)
Pt is requesting more clarification on what these numbers mean  ?Can you explain another way? ? ? ?

## 2021-10-16 NOTE — Addendum Note (Signed)
Addended by: Merri Ray R on: 10/16/2021 05:35 PM ? ? Modules accepted: Orders ? ?

## 2021-10-16 NOTE — Telephone Encounter (Signed)
Pt is requesting referral to cardiology to discuss score and gain better understanding, as for colonoscopy do you want to wait until seen next or just send referral as health maintenance item  ?

## 2021-10-30 ENCOUNTER — Encounter: Payer: Self-pay | Admitting: Physician Assistant

## 2021-11-22 ENCOUNTER — Ambulatory Visit (INDEPENDENT_AMBULATORY_CARE_PROVIDER_SITE_OTHER): Payer: Managed Care, Other (non HMO) | Admitting: Physician Assistant

## 2021-11-22 ENCOUNTER — Encounter: Payer: Self-pay | Admitting: Physician Assistant

## 2021-11-22 VITALS — BP 126/80 | HR 50 | Ht 69.0 in | Wt 166.4 lb

## 2021-11-22 DIAGNOSIS — Z1211 Encounter for screening for malignant neoplasm of colon: Secondary | ICD-10-CM | POA: Diagnosis not present

## 2021-11-22 DIAGNOSIS — K219 Gastro-esophageal reflux disease without esophagitis: Secondary | ICD-10-CM

## 2021-11-22 DIAGNOSIS — Z1212 Encounter for screening for malignant neoplasm of rectum: Secondary | ICD-10-CM

## 2021-11-22 NOTE — Patient Instructions (Signed)
If you are age 61 or older, your body mass index should be between 23-30. Your Body mass index is 24.57 kg/m. If this is out of the aforementioned range listed, please consider follow up with your Primary Care Provider.  If you are age 57 or younger, your body mass index should be between 19-25. Your Body mass index is 24.57 kg/m. If this is out of the aformentioned range listed, please consider follow up with your Primary Care Provider.   You have been scheduled for an endoscopy and colonoscopy. Please follow the written instructions given to you at your visit today. Please pick up your prep supplies at the pharmacy within the next 1-3 days. If you use inhalers (even only as needed), please bring them with you on the day of your procedure.    The [place] [facility] providers would like to encourage you to use [facility] to communicate with providers for non-urgent requests or questions.  Due to long hold times on the telephone, sending your provider a message by [facility] may be a faster and more efficient way to get a response.  Please allow 48 business hours for a response.  Please remember that this is for non-urgent requests.   It was a pleasure to see you today!  Thank you for trusting me with your gastrointestinal care!    Ellouise Newer, PA-C

## 2021-11-22 NOTE — Progress Notes (Signed)
Chief Complaint: GERD and discuss colonoscopy  HPI:    Dennis Crawford is a 61 year old male with a past medical history as listed below, who was referred to me by Dennis Agreste, MD for a complaint of GERD and to discuss a colonoscopy.    08/26/2021 patient saw his PCP and discuss longstanding intermittent heartburn and reflux.  At that time told to try Omeprazole daily.  CMP, CBC, hemoglobin A1c and PSA were all normal that day.    Today, the patient tells me that he had a colonoscopy 10 years ago in [place] for screening and this was completely "clean".  He notes he is now due for another.    Also discusses some very intermittent reflux symptoms.  Tells me that about 2 times a year he will have severe symptoms meaning that he will eat something that causes him so much discomfort that he has to stand up for about 30 minutes and walk around for it to go away.  Interestingly this seems to happen around Thanksgiving a lot.  He thinks maybe poultry is a bigger problem for him than other foods.  This only typically occurs in 1 instance and does not last for days.  In between he will have very occasional symptoms.  He would like to make sure nothing else is going on.    Denies fever, chills, weight loss, blood in his stool, change in bowel habits or abdominal pain.  Past medical history: None  Past Surgical History:  Procedure Laterality Date   FRACTURE SURGERY N/A    [facility] 04/17/2020   TUMOR EXCISION Right 04/22/2021   pt notes benign tumor removal    Current Outpatient Medications  Medication Sig Dispense Refill   Ascorbic Acid (VITAMIN C PO) Take 1 tablet by mouth 2 (two) times a week.     atorvastatin (LIPITOR) 10 MG tablet Take 1 tablet (10 mg total) by mouth daily. Can initially start few doses per week and titrate up to daily use as tolerated. 90 tablet 0   ibuprofen (ADVIL) 200 MG tablet Take 400 mg by mouth daily as needed for headache or moderate pain.     Multiple Vitamin  (MULTIVITAMIN WITH MINERALS) TABS tablet Take 1 tablet by mouth 2 (two) times a week.     omeprazole (PRILOSEC) 20 MG capsule Take 1 capsule (20 mg total) by mouth daily. 30 capsule 1   No current facility-administered medications for this visit.    Allergies as of 11/22/2021 - Review Complete 08/26/2021  Allergen Reaction Noted   Penicillins Hives 01/31/2018    Family History  Problem Relation Age of Onset   Cancer Father    Stroke Father    Cancer Maternal Grandfather     Social History   Socioeconomic History   Marital status: Married    Spouse name: Not on file   Number of children: Not on file   Years of education: Not on file   Highest education level: Not on file  Occupational History   Not on file  Tobacco Use   Smoking status: Never   Smokeless tobacco: Never  Vaping Use   Vaping Use: Never used  Substance and Sexual Activity   Alcohol use: Yes    Alcohol/week: 3.0 standard drinks    Types: 3 Cans of beer per week   Drug use: Never   Sexual activity: Yes  Other Topics Concern   Not on file  Social History Narrative   Not on file  Social Determinants of Health   Financial Resource Strain: Not on file  Food Insecurity: Not on file  Transportation Needs: Not on file  Physical Activity: Not on file  Stress: Not on file  Social Connections: Not on file  Intimate Partner Violence: Not on file    Review of Systems:    Constitutional: No weight loss, fever or chills Skin: No rash  Cardiovascular: No chest pain  Respiratory: No SOB Gastrointestinal: See HPI and otherwise negative Genitourinary: No dysuria Neurological: No headache, dizziness or syncope Musculoskeletal: No new muscle or joint pain Hematologic: No bleeding  Psychiatric: No history of depression or anxiety   Physical Exam:  Vital signs: BP 126/80   Pulse (!) 50   Ht '5\' 9"'$  (1.753 m)   Wt 166 lb 6.4 oz (75.5 kg)   SpO2 98%   BMI 24.57 kg/m    Constitutional:   Pleasant  Caucasian male appears to be in NAD, Well developed, Well nourished, alert and cooperative Head:  Normocephalic and atraumatic. Eyes:   PEERL, EOMI. No icterus. Conjunctiva pink. Ears:  Normal auditory acuity. Neck:  Supple Throat: Oral cavity and pharynx without inflammation, swelling or lesion.  Respiratory: Respirations even and unlabored. Lungs clear to auscultation bilaterally.   No wheezes, crackles, or rhonchi.  Cardiovascular: Normal S1, S2. No MRG. Regular rate and rhythm. No peripheral edema, cyanosis or pallor.  Gastrointestinal:  Soft, nondistended, nontender. No rebound or guarding. Normal bowel sounds. No appreciable masses or hepatomegaly. Rectal:  Not performed.  Msk:  Symmetrical without gross deformities. Without edema, no deformity or joint abnormality.  Neurologic:  Alert and  oriented x4;  grossly normal neurologically.  Skin:   Dry and intact without significant lesions or rashes. Psychiatric: Demonstrates good judgement and reason without abnormal affect or behaviors.  RELEVANT LABS AND IMAGING: CBC    Component Value Date/Time   WBC 5.6 08/26/2021 1217   RBC 4.97 08/26/2021 1217   HGB 14.8 08/26/2021 1217   HGB 15.3 04/19/2020 1114   HCT 44.5 08/26/2021 1217   HCT 44.6 04/19/2020 1114   PLT 286.0 08/26/2021 1217   PLT 322 04/19/2020 1114   MCV 89.6 08/26/2021 1217   MCV 87 04/19/2020 1114   MCH 29.8 04/19/2020 1114   MCHC 33.2 08/26/2021 1217   RDW 13.6 08/26/2021 1217   RDW 12.6 04/19/2020 1114   LYMPHSABS 1.8 08/26/2021 1217   MONOABS 0.5 08/26/2021 1217   EOSABS 0.1 08/26/2021 1217   BASOSABS 0.0 08/26/2021 1217    CMP     Component Value Date/Time   NA 137 08/26/2021 1217   NA 137 04/19/2020 1114   K 4.0 08/26/2021 1217   CL 100 08/26/2021 1217   CO2 29 08/26/2021 1217   GLUCOSE 96 08/26/2021 1217   BUN 15 08/26/2021 1217   BUN 17 04/19/2020 1114   CREATININE 0.97 08/26/2021 1217   CALCIUM 10.0 08/26/2021 1217   PROT 7.5 08/26/2021 1217    PROT 7.6 04/19/2020 1114   ALBUMIN 4.8 08/26/2021 1217   ALBUMIN 5.1 (H) 04/19/2020 1114   AST 10 08/26/2021 1217   ALT 17 08/26/2021 1217   ALKPHOS 53 08/26/2021 1217   BILITOT 0.8 08/26/2021 1217   BILITOT 0.5 04/19/2020 1114   GFRNONAA 92 04/19/2020 1114   GFRAA 106 04/19/2020 1114    Assessment: 1.  Screening for colorectal cancer: Last colonoscopy 10 years ago in [place] was "clean" per the patient 2.  GERD: Very occasional symptoms related to food he is  eating; likely reactive gastritis  Plan: 1.  Scheduled patient for screening colonoscopy and diagnostic EGD in the [facility] with Dr. Carlean Crawford per patient's request. (Dr. Carlean Crawford takes care of his wife)  Did provide the patient a detailed list of risks for the procedures and he agrees to proceed. Patient is appropriate for endoscopic procedure(s) in the ambulatory ([place]) setting.  2.  Patient to follow in clinic per recommendations after above.  Dennis Newer, Dennis Crawford [facility] 11/22/2021, 9:29 AM  Cc: Dennis Agreste, MD

## 2021-11-26 ENCOUNTER — Encounter: Payer: Self-pay | Admitting: Family Medicine

## 2022-01-06 ENCOUNTER — Encounter: Payer: Self-pay | Admitting: Family Medicine

## 2022-01-06 DIAGNOSIS — E785 Hyperlipidemia, unspecified: Secondary | ICD-10-CM

## 2022-01-07 NOTE — Telephone Encounter (Signed)
Pt is following up.

## 2022-01-08 ENCOUNTER — Encounter: Payer: Self-pay | Admitting: Internal Medicine

## 2022-01-08 ENCOUNTER — Ambulatory Visit: Payer: Managed Care, Other (non HMO) | Admitting: Internal Medicine

## 2022-01-08 VITALS — BP 125/81 | HR 50 | Temp 98.0°F | Resp 13 | Ht 69.0 in | Wt 166.0 lb

## 2022-01-08 DIAGNOSIS — K200 Eosinophilic esophagitis: Secondary | ICD-10-CM

## 2022-01-08 DIAGNOSIS — K222 Esophageal obstruction: Secondary | ICD-10-CM | POA: Diagnosis not present

## 2022-01-08 DIAGNOSIS — R12 Heartburn: Secondary | ICD-10-CM

## 2022-01-08 DIAGNOSIS — Z1212 Encounter for screening for malignant neoplasm of rectum: Secondary | ICD-10-CM

## 2022-01-08 DIAGNOSIS — R1310 Dysphagia, unspecified: Secondary | ICD-10-CM | POA: Diagnosis not present

## 2022-01-08 DIAGNOSIS — K219 Gastro-esophageal reflux disease without esophagitis: Secondary | ICD-10-CM

## 2022-01-08 DIAGNOSIS — K449 Diaphragmatic hernia without obstruction or gangrene: Secondary | ICD-10-CM

## 2022-01-08 DIAGNOSIS — Z1211 Encounter for screening for malignant neoplasm of colon: Secondary | ICD-10-CM

## 2022-01-08 DIAGNOSIS — K209 Esophagitis, unspecified without bleeding: Secondary | ICD-10-CM | POA: Diagnosis not present

## 2022-01-08 MED ORDER — SODIUM CHLORIDE 0.9 % IV SOLN: 500.0000 mL | Freq: Once | INTRAVENOUS | Status: DC

## 2022-01-08 NOTE — Patient Instructions (Addendum)
The esophagus is abnormal - it looks like a condition called eosinophilic esophagitis. I took biopsies and dilated the esophagus. This should hopefully fix the problems you have described - though may need other medication or procedures. I will let you know pathology results and recommendations.  You may feels some pain when you swallow - some pain after a dilation is normal - if severe let us know.  In eosinophilic esophagitis ([id number]-FILL-ik [id number]-[id number]), a type of white blood cell (eosinophil) builds up in the lining of the tube that connects your mouth to your stomach (esophagus). This buildup, which is a reaction to foods, allergens or acid reflux, can inflame or injure the esophageal tissue. Damaged esophageal tissue can lead to difficulty swallowing or cause food to get caught when you swallow.  Eosinophilic esophagitis is a chronic immune system disease. It has been identified only in the past few decades, but is now considered a major cause of digestive system (gastrointestinal) illness. Research is ongoing and will likely lead to revisions in its diagnosis and treatment.   Colonoscopy was normal.  I appreciate the opportunity to care for you. Gatha Mayer, MD         Handouts were given to your care partner on a Hiatal Hernia and the esophageal dilatation diet to follow the rest of today. You may resume your current medications today. Await biopsy results.  May take 1-3 weeks to receive pathology results. Please call if any questions or concerns.     YOU HAD AN ENDOSCOPIC PROCEDURE TODAY AT [facility]:   Refer to the procedure report that was given to you for any specific questions about what was found during the examination.  If the procedure report does not answer your questions, please call your gastroenterologist to clarify.  If you requested that your care partner not be given the details of your procedure findings, then the procedure report  has been included in a sealed envelope for you to review at your convenience later.  YOU SHOULD EXPECT: Some feelings of bloating in the abdomen. Passage of more gas than usual.  Walking can help get rid of the air that was put into your GI tract during the procedure and reduce the bloating. If you had a lower endoscopy (such as a colonoscopy or flexible sigmoidoscopy) you may notice spotting of blood in your stool or on the toilet paper. If you underwent a bowel prep for your procedure, you may not have a normal bowel movement for a few days.  Please Note:  You might notice some irritation and congestion in your nose or some drainage.  This is from the oxygen used during your procedure.  There is no need for concern and it should clear up in a day or so.  SYMPTOMS TO REPORT IMMEDIATELY:  Following lower endoscopy (colonoscopy or flexible sigmoidoscopy):  Excessive amounts of blood in the stool  Significant tenderness or worsening of abdominal pains  Swelling of the abdomen that is new, acute  Fever of 100F or higher  Following upper endoscopy (EGD)  Vomiting of blood or coffee ground material  New chest pain or pain under the shoulder blades  Painful or persistently difficult swallowing  New shortness of breath  Fever of 100F or higher  Black, tarry-looking stools  For urgent or emergent issues, a gastroenterologist can be reached at any hour by calling [phone]. Do not use MyChart messaging for urgent concerns.    DIET: Please follow the esophageal dilatation diet  the rest of today.  A handout was given to your care partner.  Drink plenty of fluids but you should avoid alcoholic beverages for 24 hours.  ACTIVITY:  You should plan to take it easy for the rest of today and you should NOT DRIVE or use heavy machinery until tomorrow (because of the sedation medicines used during the test).    FOLLOW UP: Our staff will call the number listed on your records the next business day  following your procedure.  We will call around 7:15- 8:00 am to check on you and address any questions or concerns that you may have regarding the information given to you following your procedure. If we do not reach you, we will leave a message.  If you develop any symptoms (ie: fever, flu-like symptoms, shortness of breath, cough etc.) before then, please call [phone].  If you test positive for Covid 19 in the 2 weeks post procedure, please call and report this information to [place].    If any biopsies were taken you will be contacted by phone or by letter within the next 1-3 weeks.  Please call us at [phone] if you have not heard about the biopsies in 3 weeks.    SIGNATURES/CONFIDENTIALITY: You and/or your care partner have signed paperwork which will be entered into your electronic medical record.  These signatures attest to the fact that that the information above on your After Visit Summary has been reviewed and is understood.  Full responsibility of the confidentiality of this discharge information lies with you and/or your care-partner.

## 2022-01-08 NOTE — Progress Notes (Signed)
Called to room to assist during endoscopic procedure.  Patient ID and intended procedure confirmed with present staff. Received instructions for my participation in the procedure from the performing physician.  

## 2022-01-08 NOTE — Progress Notes (Signed)
[facility] History and Physical   Primary Care Physician:  Wendie Agreste, MD   Reason for Procedure:   GERD/Heartburn and colon cancer screening + ? dysphagia  Plan:    EGD./colonoscopy     HPI: Dennis Crawford is a 61 y.o. male here for screening colonoscopy and to evaluate heartburn and refluxsxs - episodes that are severe a few times a year. ? If this could be dysphagia   History reviewed. No pertinent past medical history.  Past Surgical History:  Procedure Laterality Date   COLONOSCOPY     FRACTURE SURGERY N/A    [facility] 04/17/2020   TUMOR EXCISION Right 04/22/2021   pt notes benign tumor removal    Prior to Admission medications   Medication Sig Start Date End Date Taking? Authorizing Provider  atorvastatin (LIPITOR) 10 MG tablet Take 1 tablet (10 mg total) by mouth daily. Can initially start few doses per week and titrate up to daily use as tolerated. 10/10/21  Yes Wendie Agreste, MD  Ascorbic Acid (VITAMIN C PO) Take 1 tablet by mouth 2 (two) times a week.    [provider]  ibuprofen (ADVIL) 200 MG tablet Take 400 mg by mouth daily as needed for headache or moderate pain.    [provider]  Multiple Vitamin (MULTIVITAMIN WITH MINERALS) TABS tablet Take 1 tablet by mouth 2 (two) times a week.    [provider]    Current Outpatient Medications  Medication Sig Dispense Refill   atorvastatin (LIPITOR) 10 MG tablet Take 1 tablet (10 mg total) by mouth daily. Can initially start few doses per week and titrate up to daily use as tolerated. 90 tablet 0   Ascorbic Acid (VITAMIN C PO) Take 1 tablet by mouth 2 (two) times a week.     ibuprofen (ADVIL) 200 MG tablet Take 400 mg by mouth daily as needed for headache or moderate pain.     Multiple Vitamin (MULTIVITAMIN WITH MINERALS) TABS tablet Take 1 tablet by mouth 2 (two) times a week.     Current Facility-Administered Medications  Medication Dose Route Frequency Provider  Last Rate Last Admin   0.9 %  sodium chloride infusion  500 mL Intravenous Once Gatha Mayer, MD        Allergies as of 01/08/2022 - Review Complete 01/08/2022  Allergen Reaction Noted   Penicillins Hives 01/31/2018    Family History  Problem Relation Age of Onset   Leukemia Father    Stroke Father    Cancer Maternal Grandfather    Colon cancer Neg Hx    Stomach cancer Neg Hx    Esophageal cancer Neg Hx     Social History   Socioeconomic History   Marital status: Married    Spouse name: Not on file   Number of children: 3   Years of education: Not on file   Highest education level: Not on file  Occupational History   Occupation: Human resources officer  Tobacco Use   Smoking status: Never   Smokeless tobacco: Never  Vaping Use   Vaping Use: Never used  Substance and Sexual Activity   Alcohol use: Yes    Alcohol/week: 3.0 standard drinks of alcohol    Types: 3 Cans of beer per week   Drug use: Never   Sexual activity: Yes  Other Topics Concern   Not on file  Social History Narrative   Not on file   Social Determinants of Health   Financial Resource Strain: Not on  file  Food Insecurity: Not on file  Transportation Needs: Not on file  Physical Activity: Not on file  Stress: Not on file  Social Connections: Not on file  Intimate Partner Violence: Not on file    Review of Systems:  All other review of systems negative except as mentioned in the HPI.  Physical Exam: Vital signs BP 108/70   Pulse (!) 55   Temp 98 F (36.7 C) (Temporal)   Ht '5\' 9"'$  (1.753 m)   Wt 166 lb (75.3 kg)   SpO2 99%   BMI 24.51 kg/m   General:   Alert,  Well-developed, well-nourished, pleasant and cooperative in NAD Lungs:  Clear throughout to auscultation.   Heart:  Regular rate and rhythm; no murmurs, clicks, rubs,  or gallops. Abdomen:  Soft, nontender and nondistended. Normal bowel sounds.   Neuro/Psych:  Alert and cooperative. Normal mood and affect. A and O x 3   '@Jailyne Chieffo'$  Simonne Maffucci, MD, [facility] [phone] (pager) 01/08/2022 2:00 PM@

## 2022-01-08 NOTE — Progress Notes (Signed)
VS completed by DT.  Pt's states no medical or surgical changes since previsit or office visit.  

## 2022-01-08 NOTE — Progress Notes (Signed)
Sedate, gd SR, tolerated procedure well, VSS, report to RN 

## 2022-01-08 NOTE — Op Note (Signed)
[place] Patient Name: Dennis Crawford Procedure Date: 01/08/2022 2:03 PM MRN: [id number] Endoscopist: Gatha Mayer , MD Age: 61 Referring MD:  Date of Birth: 1960-10-27 Gender: Male Account #: [id number] Procedure:                Upper GI endoscopy Indications:              Dysphagia, Heartburn Medicines:                Monitored Anesthesia Care Procedure:                Pre-Anesthesia Assessment:                           - Prior to the procedure, a History and Physical                            was performed, and patient medications and                            allergies were reviewed. The patient's tolerance of                            previous anesthesia was also reviewed. The risks                            and benefits of the procedure and the sedation                            options and risks were discussed with the patient.                            All questions were answered, and informed consent                            was obtained. Prior Anticoagulants: The patient has                            taken no previous anticoagulant or antiplatelet                            agents. ASA Grade Assessment: II - A patient with                            mild systemic disease. After reviewing the risks                            and benefits, the patient was deemed in                            satisfactory condition to undergo the procedure.                           After obtaining informed consent, the endoscope was  passed under direct vision. Throughout the                            procedure, the patient's blood pressure, pulse, and                            oxygen saturations were monitored continuously. The                            Endoscope was introduced through the mouth, and                            advanced to the second part of duodenum. The upper                            GI endoscopy was accomplished  without difficulty.                            The patient tolerated the procedure well. Scope In: Scope Out: Findings:                 Mucosal changes including feline appearance,                            longitudinal furrows and stenosis were found in the                            entire esophagus. Biopsies were obtained from the                            proximal and distal esophagus with cold forceps for                            histology of suspected eosinophilic esophagitis. A                            TTS dilator was passed through the scope. Dilation                            with a 16-17-18 mm balloon dilator was performed to                            18 mm. The dilation site was examined and showed                            moderate mucosal disruption. Estimated blood loss                            was minimal.                           A 2 cm hiatal hernia was present.  The exam was otherwise without abnormality.                           The cardia and gastric fundus were otherwise normal                            on retroflexion. Complications:            No immediate complications. Estimated Blood Loss:     Estimated blood loss was minimal. Impression:               - Esophageal mucosal changes consistent with                            eosinophilic esophagitis. Biopsied. Dilated. 18 mm                            had impact and effect in distal esophagus,                            retrograde pull-through dilation in remainder of                            esophagus performed w/o significant dilation effect                            seen                           - 2 cm hiatal hernia.                           - The examination was otherwise normal. Recommendation:           - Patient has a contact number available for                            emergencies. The signs and symptoms of potential                            delayed  complications were discussed with the                            patient. Return to normal activities tomorrow.                            Written discharge instructions were provided to the                            patient.                           - Clear liquids x 1 hour then soft foods rest of                            day. Start prior diet tomorrow.                           -  See the other procedure note for documentation of                            additional recommendations.                           - Await pathology results. Gatha Mayer, MD 01/08/2022 2:54:19 PM This report has been signed electronically.

## 2022-01-08 NOTE — Progress Notes (Signed)
No problems noted in the recovery room. maw 

## 2022-01-08 NOTE — Op Note (Signed)
[place] Patient Name: Dennis Crawford Procedure Date: 01/08/2022 1:53 PM MRN: [id number] Endoscopist: Gatha Mayer , MD Age: 61 Referring MD:  Date of Birth: [birth year] Gender: Male Account #: [id number] Procedure:                Colonoscopy Indications:              Screening for colorectal malignant neoplasm Medicines:                Propofol per Anesthesia, Monitored Anesthesia Care Procedure:                Pre-Anesthesia Assessment:                           - Prior to the procedure, a History and Physical                            was performed, and patient medications and                            allergies were reviewed. The patient's tolerance of                            previous anesthesia was also reviewed. The risks                            and benefits of the procedure and the sedation                            options and risks were discussed with the patient.                            All questions were answered, and informed consent                            was obtained. Prior Anticoagulants: The patient has                            taken no previous anticoagulant or antiplatelet                            agents. ASA Grade Assessment: II - A patient with                            mild systemic disease. After reviewing the risks                            and benefits, the patient was deemed in                            satisfactory condition to undergo the procedure.                           After obtaining informed consent, the colonoscope  was passed under direct vision. Throughout the                            procedure, the patient's blood pressure, pulse, and                            oxygen saturations were monitored continuously. The                            Colonoscope was introduced through the anus and                            advanced to the the cecum, identified by                             appendiceal orifice and ileocecal valve. The                            quality of the bowel preparation was good. The                            colonoscopy was performed without difficulty. The                            patient tolerated the procedure well. The bowel                            preparation used was Miralax via split dose                            instruction. Scope In: 2:26:03 PM Scope Out: 2:44:39 PM Total Procedure Duration: 0 hours 18 minutes 36 seconds  Findings:                 The perianal and digital rectal examinations were                            normal.                           The colon (entire examined portion) appeared normal.                           No additional abnormalities were found on                            retroflexion. Complications:            No immediate complications. Estimated blood loss:                            None. Estimated Blood Loss:     Estimated blood loss: none. Recommendation:           - Repeat colonoscopy in 10 years for screening  purposes.                           - Clear liquids x 1 hour then soft foods rest of                            day. Start prior diet tomorrow.                           - Patient has a contact number available for                            emergencies. The signs and symptoms of potential                            delayed complications were discussed with the                            patient. Return to normal activities tomorrow.                            Written discharge instructions were provided to the                            patient.                           - Continue present medications. Gatha Mayer, MD 01/08/2022 2:55:56 PM This report has been signed electronically.

## 2022-01-09 ENCOUNTER — Telehealth: Payer: Self-pay | Admitting: *Deleted

## 2022-01-09 NOTE — Telephone Encounter (Signed)
  Follow up Call-     01/08/2022    1:19 PM  Call back number  Post procedure Call Back phone  # [phone]  Permission to leave phone message Yes     Patient questions:  Do you have a fever, pain , or abdominal swelling? No. Pain Score  0 *  Have you tolerated food without any problems? Yes.    Have you been able to return to your normal activities? Yes.    Do you have any questions about your discharge instructions: Diet   No. Medications  No. Follow up visit  No.  Do you have questions or concerns about your Care? No.  Actions: * If pain score is 4 or above: No action needed, pain <4.

## 2022-01-10 ENCOUNTER — Other Ambulatory Visit (INDEPENDENT_AMBULATORY_CARE_PROVIDER_SITE_OTHER): Payer: Managed Care, Other (non HMO)

## 2022-01-10 DIAGNOSIS — E785 Hyperlipidemia, unspecified: Secondary | ICD-10-CM | POA: Diagnosis not present

## 2022-01-10 DIAGNOSIS — Z23 Encounter for immunization: Secondary | ICD-10-CM | POA: Diagnosis not present

## 2022-01-10 LAB — C-REACTIVE PROTEIN: CRP: <1 mg/dL (ref 0.5–20.0)

## 2022-01-15 LAB — CARDIO IQ (R) APOLIPOPROTEIN B: Apolipoprotein B: 65 mg/dL (ref <90)

## 2022-01-21 NOTE — Addendum Note (Signed)
Addended by: Merri Ray R on: 01/21/2022 09:40 AM   Modules accepted: Orders

## 2022-01-24 ENCOUNTER — Other Ambulatory Visit (INDEPENDENT_AMBULATORY_CARE_PROVIDER_SITE_OTHER): Payer: Managed Care, Other (non HMO)

## 2022-01-24 DIAGNOSIS — E785 Hyperlipidemia, unspecified: Secondary | ICD-10-CM | POA: Diagnosis not present

## 2022-01-24 LAB — LIPID PANEL
Cholesterol: 176 mg/dL (ref 0–200)
HDL: 86.4 mg/dL (ref >39.00)
LDL Cholesterol: 81 mg/dL (ref 0–99)
NonHDL: 89.88
Total CHOL/HDL Ratio: 2
Triglycerides: 44 mg/dL (ref 0.0–149.0)
VLDL: 8.8 mg/dL (ref 0.0–40.0)

## 2022-01-24 LAB — COMPREHENSIVE METABOLIC PANEL
ALT: 19 U/L (ref 0–53)
AST: 8 U/L (ref 0–37)
Albumin: 4.6 g/dL (ref 3.5–5.2)
Alkaline Phosphatase: 46 U/L (ref 39–117)
BUN: 18 mg/dL (ref 6–23)
CO2: 28 mEq/L (ref 19–32)
Calcium: 9.6 mg/dL (ref 8.4–10.5)
Chloride: 102 mEq/L (ref 96–112)
Creatinine, Ser: 1.06 mg/dL (ref 0.40–1.50)
GFR: 76.12 mL/min (ref >60.00)
Glucose, Bld: 97 mg/dL (ref 70–99)
Potassium: 4.4 mEq/L (ref 3.5–5.1)
Sodium: 137 mEq/L (ref 135–145)
Total Bilirubin: 0.9 mg/dL (ref 0.2–1.2)
Total Protein: 7.2 g/dL (ref 6.0–8.3)

## 2022-01-29 ENCOUNTER — Encounter: Payer: Self-pay | Admitting: Family Medicine

## 2022-02-20 ENCOUNTER — Encounter: Payer: Self-pay | Admitting: Internal Medicine

## 2022-02-20 ENCOUNTER — Ambulatory Visit (INDEPENDENT_AMBULATORY_CARE_PROVIDER_SITE_OTHER): Payer: Managed Care, Other (non HMO) | Admitting: Internal Medicine

## 2022-02-20 VITALS — BP 100/76 | HR 54 | Ht 69.0 in | Wt 165.0 lb

## 2022-02-20 DIAGNOSIS — K200 Eosinophilic esophagitis: Secondary | ICD-10-CM | POA: Diagnosis not present

## 2022-02-20 NOTE — Progress Notes (Signed)
Dennis Crawford 61 y.o. 07/16/1960 [id number]  Assessment & Plan:   Encounter Diagnosis  Name Primary?   Eosinophilic esophagitis Yes   We reviewed the pros and cons of treating with PPI, ingested budesonide, and allergy referral.  Our plan is to observe at this point.  Should he get an uptick in symptoms I would most likely treat with ingested budesonide for a month and schedule a follow-up.  I have asked him to consider see me again in about 4 to 6 months just to check in and see how things are going.  He will continue to watch food triggers.  Up-to-date patient information on eosinophilic esophagitis provided.  I will leave it up to him regarding the DGL and Buddha's hand.  CC: Wendie Agreste, MD  Subjective:   Chief Complaint: Follow-up of eosinophilic esophagitis  HPI  61 year old white man presents with his wife for follow-up after recent EGD diagnosed eosinophilic esophagitis.  In addition to what is written below he was having some very mild dysphagia and a couple of times a year severe symptoms as outlined below.  Since the dilation at EGD as outlined below he has not had any severe dysphagia and the mild symptoms were all but gone although he had some salmon and crab cake the other night and felt a little bit of slight dysphagia he says.  He has been started on DGL and Buddha's hand supplements by his acupuncturist and [place] medicine health practitioner.  This is subsequent to the EGD.  He has no known food allergies.  He has reduced if not eliminated dairy as well at this point. He does not have seasonal allergies or allergic asthma etc.   HPI from 11/22/2021 visit with Ellouise Newer, PA-C  Also discusses some very intermittent reflux symptoms.  Tells me that about 2 times a year he will have severe symptoms meaning that he will eat something that causes him so much discomfort that he has to stand up for about 30 minutes and walk around for it to go away.   Interestingly this seems to happen around Thanksgiving a lot.  He thinks maybe poultry is a bigger problem for him than other foods.  This only typically occurs in 1 instance and does not last for days.  In between he will have very occasional symptoms.  He would like to make sure nothing else is going on.   EGD 01/08/2022 - Esophageal mucosal changes consistent with eosinophilic esophagitis. Biopsied. Dilated. 18 mm had impact and effect in distal esophagus, retrograde pull-through dilation in remainder of esophagus performed w/o significant dilation effect seen - 2 cm hiatal hernia. - The examination was otherwise normal.  Pathology showed eosinophilic esophagitis versus GERD changes up to 16 eos per high-power field   Colonoscopy 01/08/2022 Normal Allergies  Allergen Reactions   Penicillins Hives    Pt had severe hives - was told to never take again Has patient had a PCN reaction causing immediate rash, facial/tongue/throat swelling, SOB or lightheadedness with hypotension: No Has patient had a PCN reaction causing severe rash involving mucus membranes or skin necrosis: No Has patient had a PCN reaction that required hospitalization: No Has patient had a PCN reaction occurring within the last 10 years: No If all of the above answers are "NO", then may proceed with Cephalosporin use.    Current Meds  Medication Sig   Ascorbic Acid (VITAMIN C PO) Take 1 tablet by mouth 2 (two) times a week.   atorvastatin (LIPITOR)  10 MG tablet Take 1 tablet (10 mg total) by mouth daily. Can initially start few doses per week and titrate up to daily use as tolerated.   ibuprofen (ADVIL) 200 MG tablet Take 400 mg by mouth daily as needed for headache or moderate pain.   Multiple Vitamin (MULTIVITAMIN WITH MINERALS) TABS tablet Take 1 tablet by mouth 2 (two) times a week.   Past Medical History:  Diagnosis Date   Eosinophilic esophagitis    GERD (gastroesophageal reflux disease)    Parotid adenoma  2022   Past Surgical History:  Procedure Laterality Date   COLONOSCOPY     ESOPHAGOGASTRODUODENOSCOPY     FRACTURE SURGERY N/A    Phreesia 04/17/2020   TUMOR EXCISION Right 04/22/2021   pt notes benign tumor removal   Social History   Social History Narrative   Not on file   family history includes Cancer in his maternal grandfather; Leukemia in his father; Stroke in his father.   Review of Systems As per HPI  Objective:   Physical Exam BP 100/76   Pulse (!) 54   Ht '5\' 9"'$  (1.753 m)   Wt 165 lb (74.8 kg)   SpO2 97%   BMI 24.37 kg/m

## 2022-02-20 NOTE — Patient Instructions (Signed)
_______________________________________________________  If you are age 61 or older, your body mass index should be between 23-30. Your Body mass index is 24.37 kg/m. If this is out of the aforementioned range listed, please consider follow up with your Primary Care Provider.  If you are age 93 or younger, your body mass index should be between 19-25. Your Body mass index is 24.37 kg/m. If this is out of the aformentioned range listed, please consider follow up with your Primary Care Provider.   ________________________________________________________  The [facility] providers would like to encourage you to use [facility] to communicate with providers for non-urgent requests or questions.  Due to long hold times on the telephone, sending your provider a message by [facility] may be a faster and more efficient way to get a response.  Please allow 48 business hours for a response.  Please remember that this is for non-urgent requests.  _______________________________________________________  Due to recent changes in healthcare laws, you may see the results of your imaging and laboratory studies on MyChart before your provider has had a chance to review them.  We understand that in some cases there may be results that are confusing or concerning to you. Not all laboratory results come back in the same time frame and the provider may be waiting for multiple results in order to interpret others.  Please give [place] 48 hours in order for your provider to thoroughly review all the results before contacting the office for clarification of your results.   I appreciate the opportunity to care for you. Silvano Rusk, MD, [facility]

## 2022-03-25 ENCOUNTER — Ambulatory Visit (HOSPITAL_BASED_OUTPATIENT_CLINIC_OR_DEPARTMENT_OTHER): Payer: Managed Care, Other (non HMO) | Admitting: Internal Medicine

## 2022-06-25 ENCOUNTER — Telehealth: Payer: Self-pay | Admitting: Family Medicine

## 2022-06-25 DIAGNOSIS — E785 Hyperlipidemia, unspecified: Secondary | ICD-10-CM

## 2022-06-25 MED ORDER — ATORVASTATIN CALCIUM 10 MG PO TABS: 10.0000 mg | ORAL_TABLET | Freq: Every day | ORAL | 0 refills | Status: DC

## 2022-06-25 NOTE — Telephone Encounter (Signed)
Encourage patient to contact the pharmacy for refills or they can request refills through [facility]  (Please schedule appointment if patient has not been seen in over a year)  Last ov was 08/26/21  WHAT PHARMACY WOULD THEY LIKE THIS SENT TO:  [id number] [place] Atorvastatin  10 mg  NOTES/COMMENTS FROM PATIENT:       FIraanoffice please notify patient: It takes 48-72 hours to process rx refill requests Ask patient to call pharmacy to ensure rx is ready before heading there.

## 2022-06-25 NOTE — Telephone Encounter (Signed)
Sent in refill, requested patient call the office to make an appointment

## 2022-08-20 ENCOUNTER — Ambulatory Visit: Payer: Managed Care, Other (non HMO) | Admitting: Orthopedic Surgery

## 2022-08-20 ENCOUNTER — Encounter: Payer: Self-pay | Admitting: Orthopedic Surgery

## 2022-08-20 ENCOUNTER — Ambulatory Visit (INDEPENDENT_AMBULATORY_CARE_PROVIDER_SITE_OTHER): Payer: Managed Care, Other (non HMO)

## 2022-08-20 DIAGNOSIS — M25511 Pain in right shoulder: Secondary | ICD-10-CM

## 2022-08-20 NOTE — Progress Notes (Signed)
Office Visit Note   Patient: Dennis Crawford           Date of Birth: 06-Mar-1961           MRN: [id number] Visit Date: 08/20/2022 Requested by: Wendie Agreste, MD [address] [place],  [place] [address] PCP: Wendie Agreste, MD  Subjective: Chief Complaint  Patient presents with   Right Shoulder - Pain    HPI: Dennis Crawford is a 62 y.o. male who presents to the office reporting right shoulder pain.  Patient does a lot of of fitness activities in the morning with a group of friends.  He also likes to bike.  He has been doing push-ups on a daily basis for the past 2 months.  Having some anterior and slightly superior shoulder pain.  Was riding the bike for 2 hours and he developed pain with that activity as well.  Denies much in the way of weakness.  Overhead press is not a problem.  He does free weights about 1 time a week.  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  Hard for him to sleep on the right-hand side after 10 mile bike ride.  Does not report any mechanical symptoms such as popping or grinding in the shoulder..                ROS: All systems reviewed are negative as they relate to the chief complaint within the history of present illness.  Patient denies fevers or chills.  Assessment & Plan: Visit Diagnoses:  1. Right shoulder pain, unspecified chronicity     Plan: Impression is right shoulder pain which could be either AC joint irritation versus biceps tendinitis versus partial-thickness subscapularis tendinosis/tearing.  Plan is observation and activity modification with topicals as needed.  He will call us in 6 weeks for MRI scanning of the shoulder if he is not improved.  Follow-Up Instructions: No follow-ups on file.   Orders:  Orders Placed This Encounter  Procedures   XR Shoulder Right   No orders of the defined types were placed in this encounter.     Procedures: No procedures performed   Clinical Data: No additional  findings.  Objective: Vital Signs: There were no vitals taken for this visit.  Physical Exam:  Constitutional: Patient appears well-developed HEENT:  Head: Normocephalic Eyes:EOM are normal Neck: Normal range of motion Cardiovascular: Normal rate Pulmonary/chest: Effort normal Neurologic: Patient is alert Skin: Skin is warm Psychiatric: Patient has normal mood and affect  Ortho Exam: Ortho exam demonstrates range of motion on the right of 80/120/175.  Excellent rotator cuff strength infraspinatus supraspinatus subscap muscle testing.  Slight asymmetry and tenderness right AC joint versus left.  Minimal pain with crossarm adduction.  Negative O'Brien's testing negative speeds testing.  Subscap strength intact but does reproduce some pain with belly press test.  No coarse grinding or crepitus with active or passive range of motion at 15 degrees of abduction.  Patient has about the same amount of mild crepitus in both shoulders with internal and external rotation of the arm at 90 degrees of abduction.  Specialty Comments:  No specialty comments available.  Imaging: XR Shoulder Right  Result Date: 08/20/2022 AP lateral outlet radiographs right shoulder reviewed.  Shoulder is located.  Acromiohumeral distance maintained.  No glenohumeral joint or AC joint arthritis.  No acute fracture.  Visualized lung fields clear.    PMFS History: Patient Active Problem List   Diagnosis Date Noted  Pneumothorax 02/01/2018   Pneumothorax on right 01/31/2018   Past Medical History:  Diagnosis Date   Eosinophilic esophagitis    GERD (gastroesophageal reflux disease)    Parotid adenoma 2022    Family History  Problem Relation Age of Onset   Leukemia Father    Stroke Father    Cancer Maternal Grandfather    Colon cancer Neg Hx    Stomach cancer Neg Hx    Esophageal cancer Neg Hx     Past Surgical History:  Procedure Laterality Date   COLONOSCOPY     ESOPHAGOGASTRODUODENOSCOPY      FRACTURE SURGERY N/A    Phreesia 04/17/2020   TUMOR EXCISION Right 04/22/2021   pt notes benign tumor removal   Social History   Occupational History   Occupation: Human resources officer  Tobacco Use   Smoking status: Never   Smokeless tobacco: Never  Vaping Use   Vaping Use: Never used  Substance and Sexual Activity   Alcohol use: Yes    Alcohol/week: 3.0 standard drinks of alcohol    Types: 3 Cans of beer per week   Drug use: Never   Sexual activity: Yes

## 2022-08-22 ENCOUNTER — Ambulatory Visit (INDEPENDENT_AMBULATORY_CARE_PROVIDER_SITE_OTHER): Payer: Managed Care, Other (non HMO) | Admitting: Family Medicine

## 2022-08-22 ENCOUNTER — Encounter: Payer: Self-pay | Admitting: Family Medicine

## 2022-08-22 VITALS — BP 130/72 | HR 56 | Temp 98.4°F | Ht 68.0 in | Wt 163.4 lb

## 2022-08-22 DIAGNOSIS — Z130 Encounter for screening for diseases of the blood and blood-forming organs and certain disorders involving the immune mechanism: Secondary | ICD-10-CM | POA: Diagnosis not present

## 2022-08-22 DIAGNOSIS — E785 Hyperlipidemia, unspecified: Secondary | ICD-10-CM

## 2022-08-22 DIAGNOSIS — Z0000 Encounter for general adult medical examination without abnormal findings: Secondary | ICD-10-CM | POA: Diagnosis not present

## 2022-08-22 DIAGNOSIS — Z131 Encounter for screening for diabetes mellitus: Secondary | ICD-10-CM

## 2022-08-22 DIAGNOSIS — Z125 Encounter for screening for malignant neoplasm of prostate: Secondary | ICD-10-CM

## 2022-08-22 LAB — COMPREHENSIVE METABOLIC PANEL
ALT: 19 U/L (ref 0–53)
AST: 10 U/L (ref 0–37)
Albumin: 4.7 g/dL (ref 3.5–5.2)
Alkaline Phosphatase: 49 U/L (ref 39–117)
BUN: 21 mg/dL (ref 6–23)
CO2: 28 mEq/L (ref 19–32)
Calcium: 10.1 mg/dL (ref 8.4–10.5)
Chloride: 102 mEq/L (ref 96–112)
Creatinine, Ser: 1.01 mg/dL (ref 0.40–1.50)
GFR: 80.34 mL/min (ref >60.00)
Glucose, Bld: 92 mg/dL (ref 70–99)
Potassium: 4.5 mEq/L (ref 3.5–5.1)
Sodium: 138 mEq/L (ref 135–145)
Total Bilirubin: 0.9 mg/dL (ref 0.2–1.2)
Total Protein: 7.2 g/dL (ref 6.0–8.3)

## 2022-08-22 LAB — CBC
HCT: 45.6 % (ref 39.0–52.0)
Hemoglobin: 15.3 g/dL (ref 13.0–17.0)
MCHC: 33.4 g/dL (ref 30.0–36.0)
MCV: 89.2 fl (ref 78.0–100.0)
Platelets: 295 10*3/uL (ref 150.0–400.0)
RBC: 5.11 Mil/uL (ref 4.22–5.81)
RDW: 13.6 % (ref 11.5–15.5)
WBC: 6.2 10*3/uL (ref 4.0–10.5)

## 2022-08-22 LAB — LIPID PANEL
Cholesterol: 208 mg/dL — ABNORMAL HIGH (ref 0–200)
HDL: 99.1 mg/dL (ref >39.00)
LDL Cholesterol: 99 mg/dL (ref 0–99)
NonHDL: 109.14
Total CHOL/HDL Ratio: 2
Triglycerides: 51 mg/dL (ref 0.0–149.0)
VLDL: 10.2 mg/dL (ref 0.0–40.0)

## 2022-08-22 LAB — PSA: PSA: 0.82 ng/mL (ref 0.10–4.00)

## 2022-08-22 LAB — HEMOGLOBIN A1C: Hgb A1c MFr Bld: 5.7 % (ref 4.6–6.5)

## 2022-08-22 MED ORDER — ATORVASTATIN CALCIUM 10 MG PO TABS: 10.0000 mg | ORAL_TABLET | Freq: Every day | ORAL | 1 refills | Status: DC

## 2022-08-22 NOTE — Patient Instructions (Addendum)
Follow up with gastroenterology as planned. Glad to hear that you are doing well.  Preventive Care 53-62 Years Old, Male Preventive care refers to lifestyle choices and visits with your health care provider that can promote health and wellness. Preventive care visits are also called wellness exams. What can I expect for my preventive care visit? Counseling During your preventive care visit, your health care provider may ask about your: Medical history, including: Past medical problems. Family medical history. Current health, including: Emotional well-being. Home life and relationship well-being. Sexual activity. Lifestyle, including: Alcohol, nicotine or tobacco, and drug use. Access to firearms. Diet, exercise, and sleep habits. Safety issues such as seatbelt and bike helmet use. Sunscreen use. Work and work Statistician. Physical exam Your health care provider will check your: Height and weight. These may be used to calculate your BMI (body mass index). BMI is a measurement that tells if you are at a healthy weight. Waist circumference. This measures the distance around your waistline. This measurement also tells if you are at a healthy weight and may help predict your risk of certain diseases, such as type 2 diabetes and high blood pressure. Heart rate and blood pressure. Body temperature. Skin for abnormal spots. What immunizations do I need?  Vaccines are usually given at various ages, according to a schedule. Your health care provider will recommend vaccines for you based on your age, medical history, and lifestyle or other factors, such as travel or where you work. What tests do I need? Screening Your health care provider may recommend screening tests for certain conditions. This may include: Lipid and cholesterol levels. Diabetes screening. This is done by checking your blood sugar (glucose) after you have not eaten for a while (fasting). Hepatitis B test. Hepatitis C  test. HIV (human immunodeficiency virus) test. STI (sexually transmitted infection) testing, if you are at risk. Lung cancer screening. Prostate cancer screening. Colorectal cancer screening. Talk with your health care provider about your test results, treatment options, and if necessary, the need for more tests. Follow these instructions at home: Eating and drinking  Eat a diet that includes fresh fruits and vegetables, whole grains, lean protein, and low-fat dairy products. Take vitamin and mineral supplements as recommended by your health care provider. Do not drink alcohol if your health care provider tells you not to drink. If you drink alcohol: Limit how much you have to 0-2 drinks a day. Know how much alcohol is in your drink. In the [place] one drink equals one 12 oz bottle of beer (355 mL), one 5 oz glass of wine (148 mL), or one 1 oz glass of hard liquor (44 mL). Lifestyle Brush your teeth every morning and night with fluoride toothpaste. Floss one time each day. Exercise for at least 30 minutes 5 or more days each week. Do not use any products that contain nicotine or tobacco. These products include cigarettes, chewing tobacco, and vaping devices, such as e-cigarettes. If you need help quitting, ask your health care provider. Do not use drugs. If you are sexually active, practice safe sex. Use a condom or other form of protection to prevent STIs. Take aspirin only as told by your health care provider. Make sure that you understand how much to take and what form to take. Work with your health care provider to find out whether it is safe and beneficial for you to take aspirin daily. Find healthy ways to manage stress, such as: Meditation, yoga, or listening to music. Journaling. Talking to a  trusted person. Spending time with friends and family. Minimize exposure to UV radiation to reduce your risk of skin cancer. Safety Always wear your seat belt while driving or riding in a  vehicle. Do not drive: If you have been drinking alcohol. Do not ride with someone who has been drinking. When you are tired or distracted. While texting. If you have been using any mind-altering substances or drugs. Wear a helmet and other protective equipment during sports activities. If you have firearms in your house, make sure you follow all gun safety procedures. What's next? Go to your health care provider once a year for an annual wellness visit. Ask your health care provider how often you should have your eyes and teeth checked. Stay up to date on all vaccines. This information is not intended to replace advice given to you by your health care provider. Make sure you discuss any questions you have with your health care provider. Document Revised: 12/12/2020 Document Reviewed: 12/12/2020 Elsevier Patient Education  [place].

## 2022-08-22 NOTE — Progress Notes (Signed)
Subjective:  Patient ID: Dennis Crawford, male    DOB: 16-Dec-1960  Age: 62 y.o. MRN: [place]inger:[id number]  CC:  Chief Complaint  Patient presents with   Annual Exam    Pt is well and reports they are fasting pt wants to do additional labs the same as last physical     HPI Dennis Crawford presents for Annual Exam No new health issues.   Ortho, Dr. Marlou Sa for right shoulder pain,  overuse - activity modification.  Gastroenterology, Dr. Carlean Purl, eosinophilic esophagitis.  Appointment with GI in August, continued observation.  Option of PPI, budesonide or allergy referral.  Follow-up 4 to 6 months.  Avoids food triggers. No recent flares.   Hyperlipidemia: Lipitor '10mg'$  every 3rd day. Improved on prior labs.  Coronary calcium score of 114, 70th percentile on 10/07/2021. No myalgias/side effects.  Lab Results  Component Value Date   CHOL 176 01/24/2022   HDL 86.40 01/24/2022   LDLCALC 81 01/24/2022   TRIG 44.0 01/24/2022   CHOLHDL 2 01/24/2022   Lab Results  Component Value Date   ALT 19 01/24/2022   AST 8 01/24/2022   ALKPHOS 46 01/24/2022   BILITOT 0.9 01/24/2022         08/22/2022    9:45 AM 08/26/2021   11:27 AM 04/19/2020    9:55 AM 10/16/2017    9:45 AM  Depression screen PHQ 2/9  Decreased Interest 0 0 0 0  Down, Depressed, Hopeless 0 0 0 0  PHQ - 2 Score 0 0 0 0    Health Maintenance  Topic Date Due   COVID-19 Vaccine (3 - Pfizer risk series) 09/07/2022 (Originally 10/22/2019)   DTaP/Tdap/Td (2 - Td or Tdap) 04/19/2030   COLONOSCOPY (Pts 45-73yr Insurance coverage will need to be confirmed)  01/09/2032   INFLUENZA VACCINE  Completed   Hepatitis C Screening  Completed   HIV Screening  Completed   Zoster Vaccines- Shingrix  Completed   HPV VACCINES  Aged Out  Colonoscopy July 2023. Repeat 10 yrs.  Prostate: does not have family history of prostate cancer.  The natural history of prostate cancer and ongoing controversy regarding screening and potential treatment outcomes  of prostate cancer has been discussed with the patient. The meaning of a false positive PSA and a false negative PSA has been discussed. He indicates understanding of the limitations of this screening test and wishes to proceed with screening PSA testing. Lab Results  Component Value Date   PSA1 1.1 04/19/2020   PSA1 0.8 10/16/2017   PSA 0.76 08/26/2021      Immunization History  Administered Date(s) Administered   Influenza,inj,Quad PF,6+ Mos 04/19/2020   Influenza-Unspecified 06/26/2022   PFIZER(Purple Top)SARS-COV-2 Vaccination 09/03/2019, 09/24/2019   Tdap 04/19/2020   Zoster Recombinat (Shingrix) 08/26/2021, 01/10/2022  Covid booster -  recommended.  Rsv vaccine - declines.   No results found. Wears glasses. Optho appt 1 month ago - no new concerns.   Dental: every 6 months. Recent crowns.   Alcohol: 3 per week.   Tobacco: none.   Exercise: daily - F3, biking, running.   A1c elevated 2 years ago - better last year.  Lab Results  Component Value Date   HGBA1C 5.6 08/26/2021  ;  History Patient Active Problem List   Diagnosis Date Noted   Pneumothorax 02/01/2018   Pneumothorax on right 01/31/2018   Past Medical History:  Diagnosis Date   Eosinophilic esophagitis    GERD (gastroesophageal reflux disease)    Parotid adenoma 2022  Past Surgical History:  Procedure Laterality Date   COLONOSCOPY     ESOPHAGOGASTRODUODENOSCOPY     FRACTURE SURGERY N/A    [employer] 04/17/2020   TUMOR EXCISION Right 04/22/2021   pt notes benign tumor removal   Allergies  Allergen Reactions   Penicillins Hives    Pt had severe hives - was told to never take again Has patient had a PCN reaction causing immediate rash, facial/tongue/throat swelling, SOB or lightheadedness with hypotension: No Has patient had a PCN reaction causing severe rash involving mucus membranes or skin necrosis: No Has patient had a PCN reaction that required hospitalization: No Has patient had a PCN  reaction occurring within the last 10 years: No If all of the above answers are "NO", then may proceed with Cephalosporin use.    Prior to Admission medications   Medication Sig Start Date End Date Taking? Authorizing Provider  AMBULATORY NON FORMULARY MEDICATION Take 1 tablet by mouth daily. Medication Name: [facility]   Yes [provider]  AMBULATORY NON FORMULARY MEDICATION Take 3 tablets by mouth daily. [facility]   Yes [provider]  Ascorbic Acid (VITAMIN C PO) Take 1 tablet by mouth 2 (two) times a week.   Yes [provider]  atorvastatin (LIPITOR) 10 MG tablet Take 1 tablet (10 mg total) by mouth daily. Can initially start few doses per week and titrate up to daily use as tolerated. 06/25/22  Yes Wendie Agreste, MD  ibuprofen (ADVIL) 200 MG tablet Take 400 mg by mouth daily as needed for headache or moderate pain.   Yes [provider]  Multiple Vitamin (MULTIVITAMIN WITH MINERALS) TABS tablet Take 1 tablet by mouth 2 (two) times a week.   Yes [provider]   Social History   Socioeconomic History   Marital status: Married    Spouse name: Not on file   Number of children: 3   Years of education: Not on file   Highest education level: Not on file  Occupational History   Occupation: Recruiter  Tobacco Use   Smoking status: Never   Smokeless tobacco: Never  Vaping Use   Vaping Use: Never used  Substance and Sexual Activity   Alcohol use: Yes    Alcohol/week: 3.0 standard drinks of alcohol    Types: 3 Cans of beer per week   Drug use: Never   Sexual activity: Yes  Other Topics Concern   Not on file  Social History Narrative   Not on file   Social Determinants of Health   Financial Resource Strain: Not on file  Food Insecurity: Not on file  Transportation Needs: Not on file  Physical Activity: Not on file  Stress: Not on file  Social Connections: Not on file  Intimate Partner Violence: Not on file    Review of  Systems  13 point review of systems per patient health survey noted.  Negative other than as indicated above or in HPI.   Objective:   Vitals:   08/22/22 0947  BP: 130/72  Pulse: (!) 56  Temp: 98.4 F (36.9 C)  TempSrc: Temporal  SpO2: 96%  Weight: 163 lb 6.4 oz (74.1 kg)  Height: '5\' 8"'$  (1.727 m)     Physical Exam Vitals reviewed.  Constitutional:      Appearance: He is well-developed.  HENT:     Head: Normocephalic and atraumatic.     Right Ear: External ear normal.     Left Ear: External ear normal.  Eyes:  Conjunctiva/sclera: Conjunctivae normal.     Pupils: Pupils are equal, round, and reactive to light.  Neck:     Thyroid: No thyromegaly.  Cardiovascular:     Rate and Rhythm: Normal rate and regular rhythm.     Heart sounds: Normal heart sounds.  Pulmonary:     Effort: Pulmonary effort is normal. No respiratory distress.     Breath sounds: Normal breath sounds. No wheezing.  Abdominal:     General: There is no distension.     Palpations: Abdomen is soft.     Tenderness: There is no abdominal tenderness.  Musculoskeletal:        General: No tenderness. Normal range of motion.     Cervical back: Normal range of motion and neck supple.  Lymphadenopathy:     Cervical: No cervical adenopathy.  Skin:    General: Skin is warm and dry.  Neurological:     Mental Status: He is alert and oriented to person, place, and time.     Deep Tendon Reflexes: Reflexes are normal and symmetric.  Psychiatric:        Behavior: Behavior normal.        Assessment & Plan:  Kalex Klontz is a 62 y.o. male . Annual physical exam - Plan: Comprehensive metabolic panel, Hemoglobin A1c, Lipid panel, PSA, CBC  - -anticipatory guidance as below in AVS, screening labs above. Health maintenance items as above in HPI discussed/recommended as applicable.   - esophagitis stable. Follow up with GI as needed.   Screening for prostate cancer - Plan: PSA  Hyperlipidemia, unspecified  hyperlipidemia type - Plan: Comprehensive metabolic panel, Lipid panel, atorvastatin (LIPITOR) 10 MG tablet  -  Stable, tolerating current regimen. Medications refilled. Labs pending as above.   Screening for diabetes mellitus - Plan: Comprehensive metabolic panel, Hemoglobin A1c  Screening, anemia, deficiency, iron - Plan: CBC   Meds ordered this encounter  Medications   atorvastatin (LIPITOR) 10 MG tablet    Sig: Take 1 tablet (10 mg total) by mouth daily. Can initially start few doses per week and titrate up to daily use as tolerated.    Dispense:  90 tablet    Refill:  1   Patient Instructions  Follow up with gastroenterology as planned. Glad to hear that you are doing well.  Preventive Care 23-1 Years Old, Male Preventive care refers to lifestyle choices and visits with your health care provider that can promote health and wellness. Preventive care visits are also called wellness exams. What can I expect for my preventive care visit? Counseling During your preventive care visit, your health care provider may ask about your: Medical history, including: Past medical problems. Family medical history. Current health, including: Emotional well-being. Home life and relationship well-being. Sexual activity. Lifestyle, including: Alcohol, nicotine or tobacco, and drug use. Access to firearms. Diet, exercise, and sleep habits. Safety issues such as seatbelt and bike helmet use. Sunscreen use. Work and work Statistician. Physical exam Your health care provider will check your: Height and weight. These may be used to calculate your BMI (body mass index). BMI is a measurement that tells if you are at a healthy weight. Waist circumference. This measures the distance around your waistline. This measurement also tells if you are at a healthy weight and may help predict your risk of certain diseases, such as type 2 diabetes and high blood pressure. Heart rate and blood pressure. Body  temperature. Skin for abnormal spots. What immunizations do I need?  Vaccines are usually  given at various ages, according to a schedule. Your health care provider will recommend vaccines for you based on your age, medical history, and lifestyle or other factors, such as travel or where you work. What tests do I need? Screening Your health care provider may recommend screening tests for certain conditions. This may include: Lipid and cholesterol levels. Diabetes screening. This is done by checking your blood sugar (glucose) after you have not eaten for a while (fasting). Hepatitis B test. Hepatitis C test. HIV (human immunodeficiency virus) test. STI (sexually transmitted infection) testing, if you are at risk. Lung cancer screening. Prostate cancer screening. Colorectal cancer screening. Talk with your health care provider about your test results, treatment options, and if necessary, the need for more tests. Follow these instructions at home: Eating and drinking  Eat a diet that includes fresh fruits and vegetables, whole grains, lean protein, and low-fat dairy products. Take vitamin and mineral supplements as recommended by your health care provider. Do not drink alcohol if your health care provider tells you not to drink. If you drink alcohol: Limit how much you have to 0-2 drinks a day. Know how much alcohol is in your drink. In the [place] one drink equals one 12 oz bottle of beer (355 mL), one 5 oz glass of wine (148 mL), or one 1 oz glass of hard liquor (44 mL). Lifestyle Brush your teeth every morning and night with fluoride toothpaste. Floss one time each day. Exercise for at least 30 minutes 5 or more days each week. Do not use any products that contain nicotine or tobacco. These products include cigarettes, chewing tobacco, and vaping devices, such as e-cigarettes. If you need help quitting, ask your health care provider. Do not use drugs. If you are sexually active,  practice safe sex. Use a condom or other form of protection to prevent STIs. Take aspirin only as told by your health care provider. Make sure that you understand how much to take and what form to take. Work with your health care provider to find out whether it is safe and beneficial for you to take aspirin daily. Find healthy ways to manage stress, such as: Meditation, yoga, or listening to music. Journaling. Talking to a trusted person. Spending time with friends and family. Minimize exposure to UV radiation to reduce your risk of skin cancer. Safety Always wear your seat belt while driving or riding in a vehicle. Do not drive: If you have been drinking alcohol. Do not ride with someone who has been drinking. When you are tired or distracted. While texting. If you have been using any mind-altering substances or drugs. Wear a helmet and other protective equipment during sports activities. If you have firearms in your house, make sure you follow all gun safety procedures. What's next? Go to your health care provider once a year for an annual wellness visit. Ask your health care provider how often you should have your eyes and teeth checked. Stay up to date on all vaccines. This information is not intended to replace advice given to you by your health care provider. Make sure you discuss any questions you have with your health care provider. Document Revised: 12/12/2020 Document Reviewed: 12/12/2020 Elsevier Patient Education  [place],   Merri Ray, MD [place], [facility] 08/22/22 10:04 AM

## 2022-11-20 ENCOUNTER — Encounter: Payer: Self-pay | Admitting: Family Medicine

## 2023-07-02 ENCOUNTER — Telehealth: Payer: Self-pay

## 2023-07-02 DIAGNOSIS — Z125 Encounter for screening for malignant neoplasm of prostate: Secondary | ICD-10-CM

## 2023-07-02 DIAGNOSIS — Z0000 Encounter for general adult medical examination without abnormal findings: Secondary | ICD-10-CM

## 2023-07-02 DIAGNOSIS — Z131 Encounter for screening for diabetes mellitus: Secondary | ICD-10-CM

## 2023-07-02 DIAGNOSIS — E785 Hyperlipidemia, unspecified: Secondary | ICD-10-CM

## 2023-07-02 DIAGNOSIS — R7309 Other abnormal glucose: Secondary | ICD-10-CM

## 2023-07-02 DIAGNOSIS — Z130 Encounter for screening for diseases of the blood and blood-forming organs and certain disorders involving the immune mechanism: Secondary | ICD-10-CM

## 2023-07-02 NOTE — Telephone Encounter (Signed)
 Copied from CRM [id number]. Topic: Clinical - Medical Advice >> Jul 02, 2023  3:43 PM Sonny Dandy B wrote: Reason for CRM: pt request labs prior to visit on 07/16/23 Ask is provider would send in a order for labs.

## 2023-07-03 NOTE — Telephone Encounter (Signed)
 Labs ordered, please schedule lab visit prior to the 16th appointment.

## 2023-07-03 NOTE — Addendum Note (Signed)
 Addended by: Meredith Staggers R on: 07/03/2023 08:18 AM   Modules accepted: Orders

## 2023-07-03 NOTE — Telephone Encounter (Signed)
 Pt has been made aware and will go to [employer] for walk in convenience

## 2023-07-08 IMAGING — CT CT CARDIAC CORONARY ARTERY CALCIUM SCORE
3 series · 14 of 20 positions shown, 16 images · non-contrast
Comparison: None.
COMPARISON: None.

Addendum:
EXAM:
OVER-READ INTERPRETATION  CT CHEST

The following report is an over-read performed by radiologist Dr.
Porley Sousluaga [REDACTED] on 10/07/2021. This
over-read does not include interpretation of cardiac or coronary
anatomy or pathology. The coronary calcium score interpretation by
the cardiologist is attached.
CLINICAL DATA: Risk stratification: 60 year-old White Male
Coronary Calcium Score
TECHNIQUE: The patient was scanned on a Siemens Force scanner. Axial
non-contrast 3 mm slices were carried out through the heart. The
data set was analyzed on a dedicated work station and scored using
the Agatson method.

[Series 2: cascseq 2.0 sa36 70% (id) · axial · 0.40mm/px · z∈[-254,-152]mm · 4 of 85 slices shown]
[im 17/85  vessel]
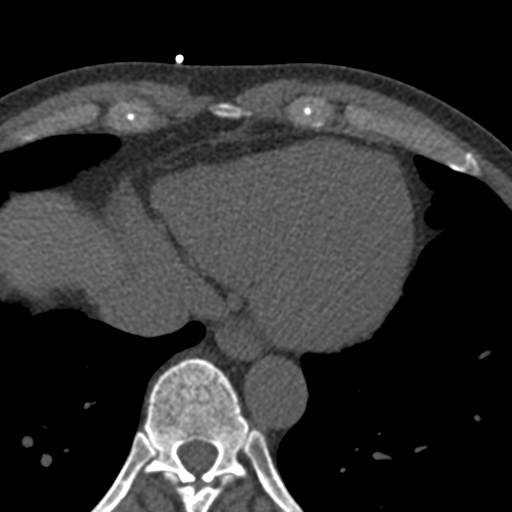
[im 34/85  vessel]
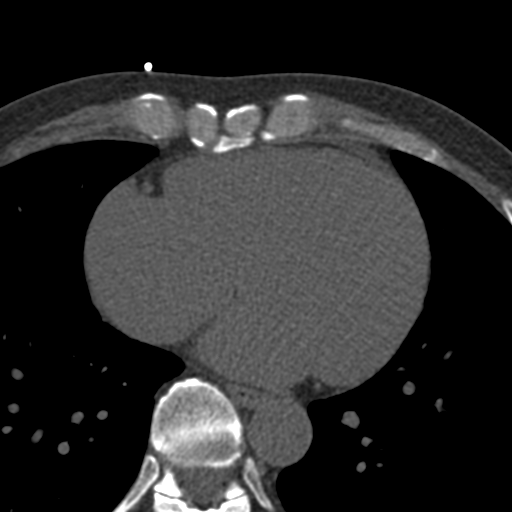
[im 51/85  vessel]
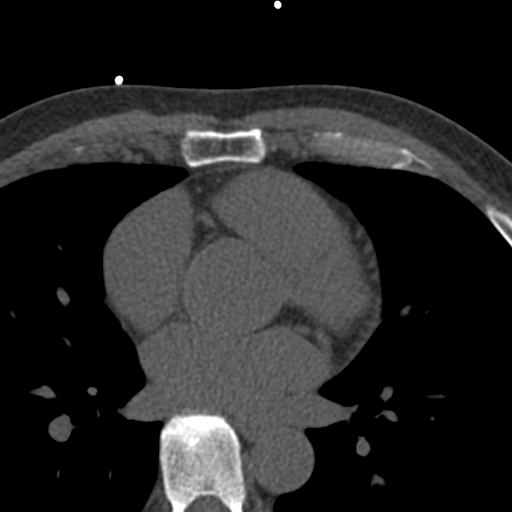
[im 68/85  vessel]
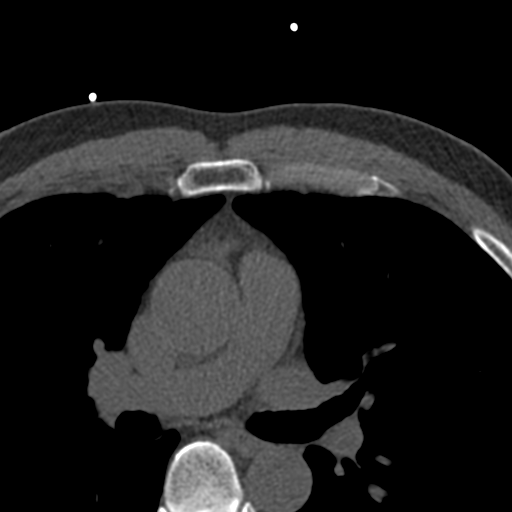

[Series 3: cascseq 2.0 bf37 st · axial · 0.72mm/px · z∈[-258,-146]mm · 5 of 85 slices shown, 7 images]
[im 15/85  vessel]
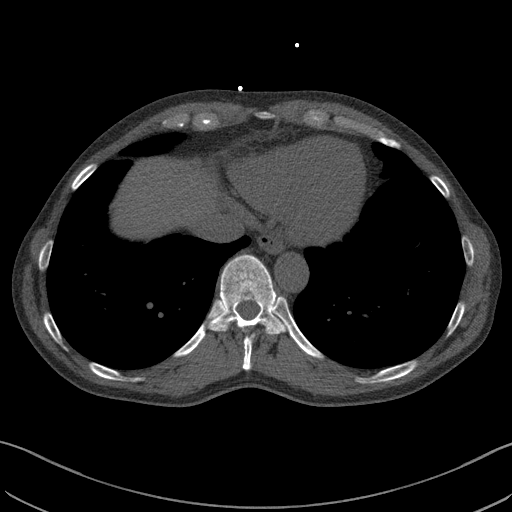
[im 15/85  lung]
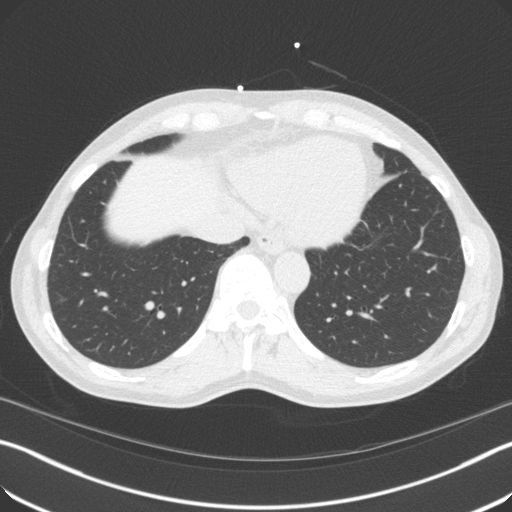
[im 29/85  vessel]
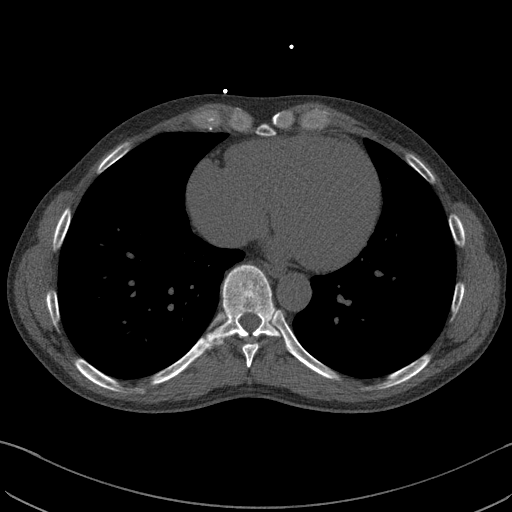
[im 43/85  vessel]
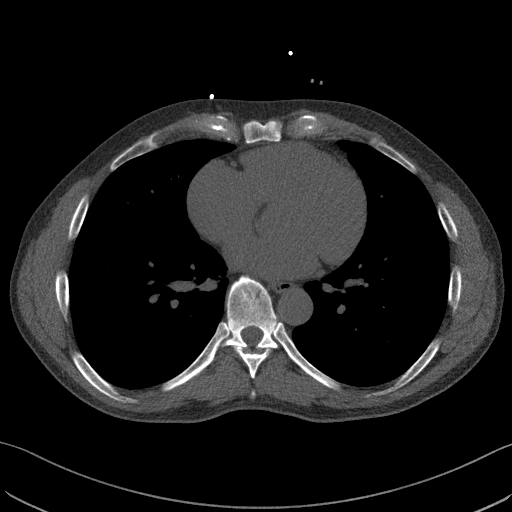
[im 57/85  vessel]
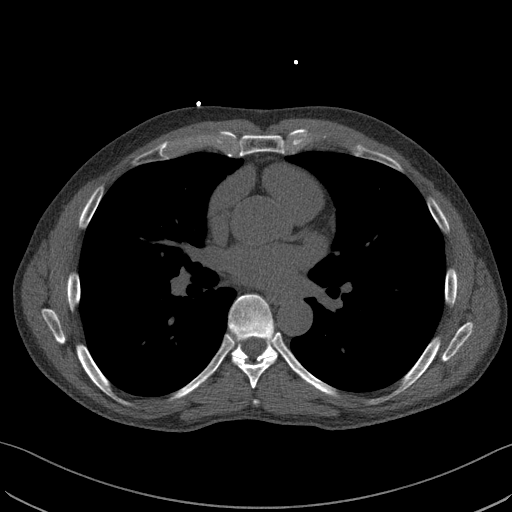
[im 71/85  vessel]
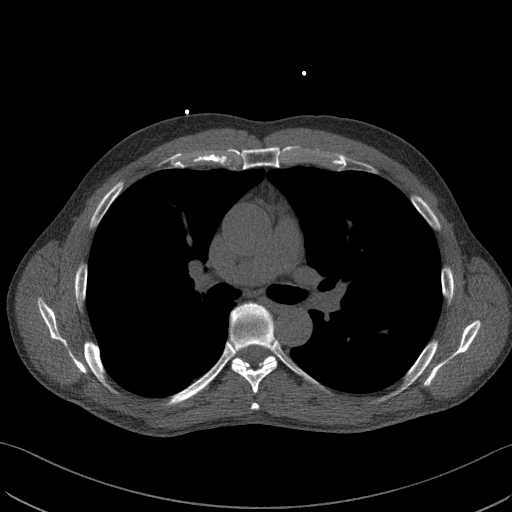
[im 71/85  lung]
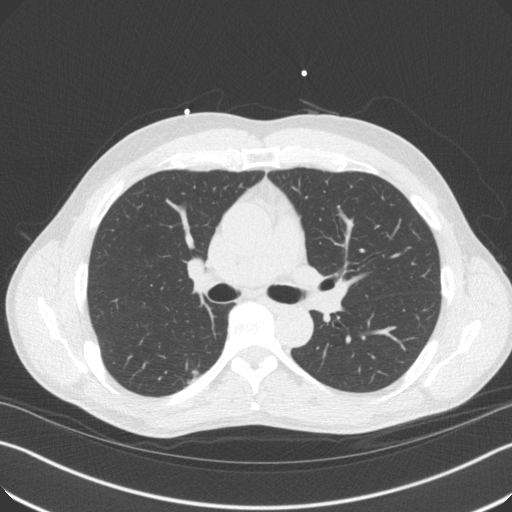

[Series 4: cascseq 2.0 br59 lung · axial · 0.72mm/px · z∈[-258,-146]mm · 5 of 85 slices shown]
[im 15/85  lung]
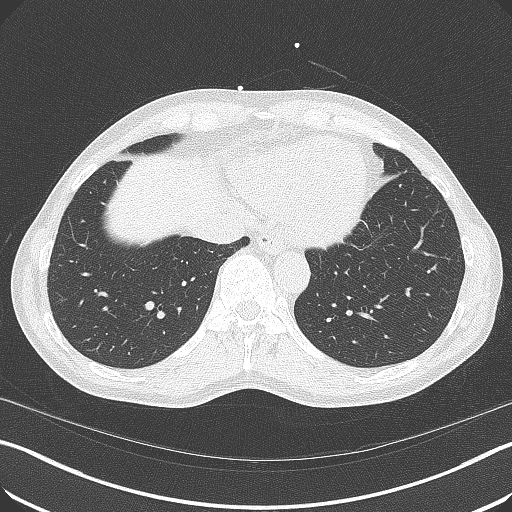
[im 29/85  lung]
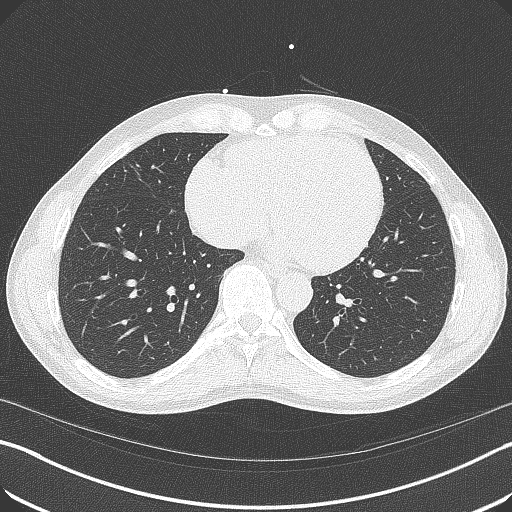
[im 43/85  lung]
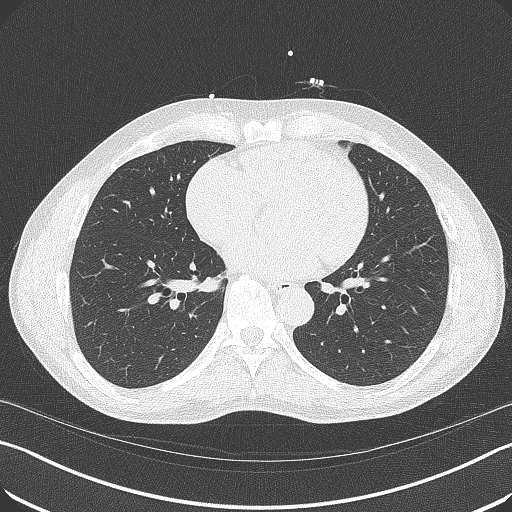
[im 57/85  lung]
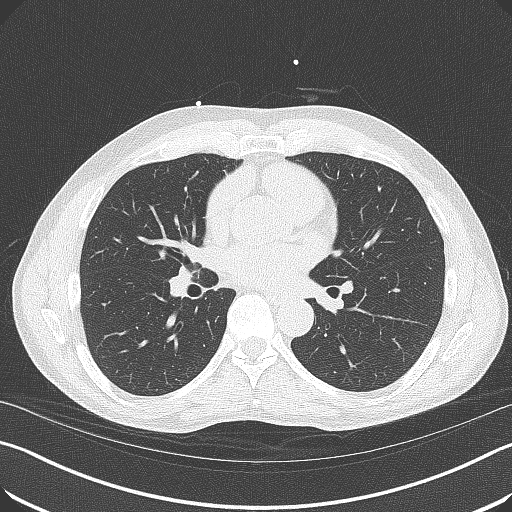
[im 71/85  lung]
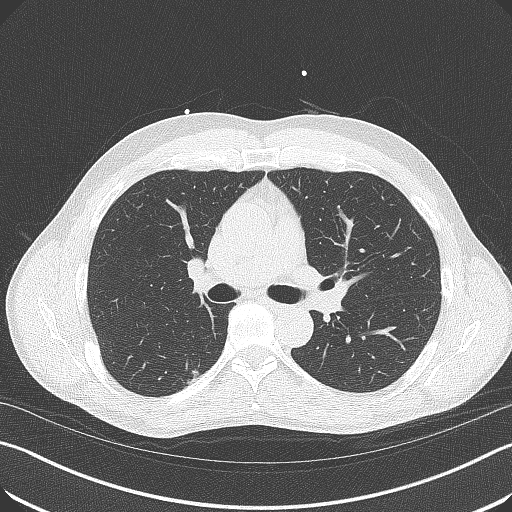

[14 of 20 positions shown; findings below may reference images not displayed]

FINDINGS: Within the visualized portions of the thorax there are no suspicious
appearing pulmonary nodules or masses, there is no acute
consolidative airspace disease, no pleural effusions, no
pneumothorax and no lymphadenopathy. Visualized portions of the
upper abdomen are unremarkable. There are no aggressive appearing
lytic or blastic lesions noted in the visualized portions of the
skeleton.
IMPRESSION: 1. No significant incidental noncardiac findings are noted.
FINDINGS: Non-cardiac: See separate report from [REDACTED].

Ascending Aorta: Normal caliber.

Small ascending aorta calcification.

Pericardium: Normal.

Coronary arteries: Normal origins.

Coronary Calcium Score:

Left main: 0

Left anterior descending artery: 114

Left circumflex artery: 0

Right coronary artery: 0

Total: 114

Percentile: 70th for age, sex, and race matched control.
IMPRESSION: 1. Coronary calcium score of 114. This was 70th percentile for age,
gender, and race matched controls.

2.  Small ascending aorta calcification.

RECOMMENDATIONS:



If CAC = 0, it is reasonable to withhold statin therapy and reassess
in 5 to 10 years, as long as higher risk conditions are absent
(diabetes mellitus, family history of premature CHD in first degree
relatives (males <55 years; females <65 years), cigarette smoking,
LDL >=190 mg/dL or other independent risk factors).

If CAC is 1 to 99, it is reasonable to initiate statin therapy for
patients >=55 years of age.

If CAC is >=100 or >=75th percentile, it is reasonable to initiate
statin therapy at any age.

Cardiology referral should be considered for patients with CAC
scores =400 or >=75th percentile.

*7536 AHA/ACC/AACVPR/AAPA/ABC/ZMARY/SHINGENGE/KALLEE/Harold/ZAGORAC/DLEKE/JOSHJAX
Guideline on the Management of Blood Cholesterol: A Report of the
American College of Cardiology/American Heart Association Task Force
on Clinical Practice Guidelines. J Am Coll Cardiol.
3211;73(24):4975-4946.

*** End of Addendum ***
EXAM:
OVER-READ INTERPRETATION  CT CHEST

The following report is an over-read performed by radiologist Dr.
Porley Sousluaga [REDACTED] on 10/07/2021. This
over-read does not include interpretation of cardiac or coronary
anatomy or pathology. The coronary calcium score interpretation by
the cardiologist is attached.
FINDINGS: Within the visualized portions of the thorax there are no suspicious
appearing pulmonary nodules or masses, there is no acute
consolidative airspace disease, no pleural effusions, no
pneumothorax and no lymphadenopathy. Visualized portions of the
upper abdomen are unremarkable. There are no aggressive appearing
lytic or blastic lesions noted in the visualized portions of the
skeleton.
IMPRESSION: 1. No significant incidental noncardiac findings are noted.

## 2023-07-13 ENCOUNTER — Other Ambulatory Visit (INDEPENDENT_AMBULATORY_CARE_PROVIDER_SITE_OTHER): Payer: Self-pay

## 2023-07-13 DIAGNOSIS — Z130 Encounter for screening for diseases of the blood and blood-forming organs and certain disorders involving the immune mechanism: Secondary | ICD-10-CM | POA: Diagnosis not present

## 2023-07-13 DIAGNOSIS — E785 Hyperlipidemia, unspecified: Secondary | ICD-10-CM | POA: Diagnosis not present

## 2023-07-13 DIAGNOSIS — R7309 Other abnormal glucose: Secondary | ICD-10-CM | POA: Diagnosis not present

## 2023-07-13 DIAGNOSIS — Z125 Encounter for screening for malignant neoplasm of prostate: Secondary | ICD-10-CM | POA: Diagnosis not present

## 2023-07-13 DIAGNOSIS — Z131 Encounter for screening for diabetes mellitus: Secondary | ICD-10-CM

## 2023-07-13 DIAGNOSIS — Z0000 Encounter for general adult medical examination without abnormal findings: Secondary | ICD-10-CM

## 2023-07-13 LAB — COMPREHENSIVE METABOLIC PANEL
ALT: 21 U/L (ref 0–53)
AST: 12 U/L (ref 0–37)
Albumin: 4.3 g/dL (ref 3.5–5.2)
Alkaline Phosphatase: 45 U/L (ref 39–117)
BUN: 22 mg/dL (ref 6–23)
CO2: 29 meq/L (ref 19–32)
Calcium: 9.2 mg/dL (ref 8.4–10.5)
Chloride: 104 meq/L (ref 96–112)
Creatinine, Ser: 1.36 mg/dL (ref 0.40–1.50)
GFR: 55.87 mL/min — ABNORMAL LOW (ref >60.00)
Glucose, Bld: 97 mg/dL (ref 70–99)
Potassium: 4.1 meq/L (ref 3.5–5.1)
Sodium: 134 meq/L — ABNORMAL LOW (ref 135–145)
Total Bilirubin: 0.8 mg/dL (ref 0.2–1.2)
Total Protein: 6.4 g/dL (ref 6.0–8.3)

## 2023-07-13 LAB — LIPID PANEL
Cholesterol: 183 mg/dL (ref 0–200)
HDL: 89.5 mg/dL (ref >39.00)
LDL Cholesterol: 84 mg/dL (ref 0–99)
NonHDL: 93.07
Total CHOL/HDL Ratio: 2
Triglycerides: 47 mg/dL (ref 0.0–149.0)
VLDL: 9.4 mg/dL (ref 0.0–40.0)

## 2023-07-13 LAB — CBC
HCT: 43.9 % (ref 39.0–52.0)
Hemoglobin: 14.7 g/dL (ref 13.0–17.0)
MCHC: 33.4 g/dL (ref 30.0–36.0)
MCV: 90.2 fL (ref 78.0–100.0)
Platelets: 288 10*3/uL (ref 150.0–400.0)
RBC: 4.87 Mil/uL (ref 4.22–5.81)
RDW: 13.2 % (ref 11.5–15.5)
WBC: 4.9 10*3/uL (ref 4.0–10.5)

## 2023-07-13 LAB — HEMOGLOBIN A1C: Hgb A1c MFr Bld: 5.9 % (ref 4.6–6.5)

## 2023-07-13 LAB — PSA: PSA: 0.75 ng/mL (ref 0.10–4.00)

## 2023-07-15 NOTE — Telephone Encounter (Signed)
 Pt's appointment has been updated for tomorrow and CPE for February has been canceled per pt request.

## 2023-07-16 ENCOUNTER — Encounter: Payer: BC Managed Care – PPO | Admitting: Family Medicine

## 2023-07-16 ENCOUNTER — Encounter: Payer: Self-pay | Admitting: Family Medicine

## 2023-07-16 ENCOUNTER — Ambulatory Visit (INDEPENDENT_AMBULATORY_CARE_PROVIDER_SITE_OTHER): Payer: BC Managed Care – PPO | Admitting: Family Medicine

## 2023-07-16 VITALS — BP 126/70 | HR 59 | Temp 97.8°F | Ht 68.0 in | Wt 165.2 lb

## 2023-07-16 DIAGNOSIS — Z0000 Encounter for general adult medical examination without abnormal findings: Secondary | ICD-10-CM

## 2023-07-16 DIAGNOSIS — E785 Hyperlipidemia, unspecified: Secondary | ICD-10-CM

## 2023-07-16 DIAGNOSIS — R944 Abnormal results of kidney function studies: Secondary | ICD-10-CM | POA: Diagnosis not present

## 2023-07-16 DIAGNOSIS — E871 Hypo-osmolality and hyponatremia: Secondary | ICD-10-CM

## 2023-07-16 DIAGNOSIS — Z23 Encounter for immunization: Secondary | ICD-10-CM | POA: Diagnosis not present

## 2023-07-16 MED ORDER — ATORVASTATIN CALCIUM 10 MG PO TABS: 10.0000 mg | ORAL_TABLET | Freq: Every day | ORAL | 1 refills | Status: DC

## 2023-07-16 NOTE — Progress Notes (Signed)
Subjective:  Patient ID: Dennis Crawford, male    DOB: 03-30-1961  Age: 63 y.o. MRN: [id number]  CC:  Chief Complaint  Patient presents with   Annual Exam    Labs completed 07/13/23 -discuss results today, pt notes no concerns     HPI Dennis Crawford presents for Annual Exam PCP, [facility], Dr. [facility], Dr. Leone Payor, history of eosinophilic esophagitis with treatment of observation previously with option of PPI, budesonide or allergy referral.  Denies recent significant flares, mild sx's occasionally.   Hyperlipidemia: Treated with Lipitor 10 mg every 3 days when discussed at his last physical. Every other day now Coronary calcium score of 114 with 70 percentile on 10/07/2021.  Recent labs noted, LDL 84.  No new myalgias or side effects with meds.  Lab Results  Component Value Date   CHOL 183 07/13/2023   HDL 89.50 07/13/2023   LDLCALC 84 07/13/2023   TRIG 47.0 07/13/2023   CHOLHDL 2 07/13/2023   Lab Results  Component Value Date   ALT 21 07/13/2023   AST 12 07/13/2023   ALKPHOS 45 07/13/2023   BILITOT 0.8 07/13/2023       07/16/2023    8:31 AM 08/22/2022    9:45 AM 08/26/2021   11:27 AM 04/19/2020    9:55 AM 10/16/2017    9:45 AM  Depression screen PHQ 2/9  Decreased Interest 0 0 0 0 0  Down, Depressed, Hopeless 0 0 0 0 0  PHQ - 2 Score 0 0 0 0 0  Altered sleeping 0      Tired, decreased energy 0      Change in appetite 0      Feeling bad or failure about yourself  0      Trouble concentrating 0      Moving slowly or fidgety/restless 0      Suicidal thoughts 0      PHQ-9 Score 0        Health Maintenance  Topic Date Due   COVID-19 Vaccine (6 - 2024-25 season) 06/01/2023   DTaP/Tdap/Td (2 - Td or Tdap) 04/19/2030   Colonoscopy  01/09/2032   INFLUENZA VACCINE  Completed   Hepatitis C Screening  Completed   HIV Screening  Completed   Zoster Vaccines- Shingrix  Completed   Pneumococcal Vaccine 6-81 Years old  Aged Out   HPV VACCINES  Aged  Out  Colonoscopy July 2023, repeat 10 years. Prostate: does not have family history of prostate cancer The natural history of prostate cancer and ongoing controversy regarding screening and potential treatment outcomes of prostate cancer has been discussed with the patient. The meaning of a false positive PSA and a false negative PSA has been discussed. He indicates understanding of the limitations of this screening test and wishes  to proceed with screening PSA testing - recent testing stable.  Lab Results  Component Value Date   PSA1 1.1 04/19/2020   PSA1 0.8 10/16/2017   PSA 0.75 07/13/2023   PSA 0.82 08/22/2022   PSA 0.76 08/26/2021      Immunization History  Administered Date(s) Administered   Influenza Inj Mdck Quad Pf 04/18/2017   Influenza,inj,Quad PF,6+ Mos 04/19/2020   Influenza-Unspecified 06/26/2022, 04/06/2023   PFIZER(Purple Top)SARS-COV-2 Vaccination 09/03/2019, 09/24/2019, 05/18/2020, 05/25/2021   PNEUMOCOCCAL CONJUGATE-20 07/16/2023   Pfizer(Comirnaty)Fall Seasonal Vaccine 12 years and older 04/06/2023   Tdap 04/19/2020   Zoster Recombinant(Shingrix) 08/26/2021, 01/10/2022  Pna vaccine - prevnar recommended - today.   No results found.  Wears  glasses.  Last Optho/optometry appointment - last year. Due for appt soon.   Dental: Every 6 months.  Alcohol: Approximately 3 drinks per week prior, no alcohol since start of year.   Tobacco: None  Exercise: Daily exercise with F3, biking, running.  Mild elevated A1c in the past, improved with exercise previously, 5.8, 5.6, 5.7 most recently last year.  Recent testing with slight increase at 5.9. Trail half marathon this weekend. 2 last year.  No recent injuries. Some soreness inside left elbow. Some exercises and felt different, nki.  Takes creatine. Did have long run day prior to recent labs. Possible dehydration then. Creat 1.36 na 134. Will repeat when well hydrated. Not today.  Lab Results  Component Value Date    HGBA1C 5.9 07/13/2023   Wt Readings from Last 3 Encounters:  07/16/23 165 lb 3.2 oz (74.9 kg)  08/22/22 163 lb 6.4 oz (74.1 kg)  02/20/22 165 lb (74.8 kg)      History Patient Active Problem List   Diagnosis Date Noted   Pneumothorax 02/01/2018   Pneumothorax on right 01/31/2018   Past Medical History:  Diagnosis Date   Eosinophilic esophagitis    GERD (gastroesophageal reflux disease)    Parotid adenoma 2022   Past Surgical History:  Procedure Laterality Date   COLONOSCOPY     ESOPHAGOGASTRODUODENOSCOPY     FRACTURE SURGERY N/A    Phreesia 04/17/2020   TUMOR EXCISION Right 04/22/2021   pt notes benign tumor removal   Allergies  Allergen Reactions   Penicillins Hives    Pt had severe hives - was told to never take again Has patient had a PCN reaction causing immediate rash, facial/tongue/throat swelling, SOB or lightheadedness with hypotension: No Has patient had a PCN reaction causing severe rash involving mucus membranes or skin necrosis: No Has patient had a PCN reaction that required hospitalization: No Has patient had a PCN reaction occurring within the last 10 years: No If all of the above answers are "NO", then may proceed with Cephalosporin use.    Prior to Admission medications   Medication Sig Start Date End Date Taking? Authorizing Provider  AMBULATORY NON FORMULARY MEDICATION Take 1 tablet by mouth daily. Medication Name: [facility]   Yes [provider]  AMBULATORY NON FORMULARY MEDICATION Take 3 tablets by mouth daily. Budda's Hand PRN   Yes [provider]  Ascorbic Acid (VITAMIN C PO) Take 1 tablet by mouth 2 (two) times a week.   Yes [provider]  atorvastatin (LIPITOR) 10 MG tablet Take 1 tablet (10 mg total) by mouth daily. Can initially start few doses per week and titrate up to daily use as tolerated. Patient taking differently: Take 10 mg by mouth daily. Pt has been taking every other day 08/22/22  Yes Shade Flood, MD   ibuprofen (ADVIL) 200 MG tablet Take 400 mg by mouth daily as needed for headache or moderate pain.   Yes [provider]  Multiple Vitamin (MULTIVITAMIN WITH MINERALS) TABS tablet Take 1 tablet by mouth 2 (two) times a week.   Yes [provider]   Social History   Socioeconomic History   Marital status: Married    Spouse name: Not on file   Number of children: 3   Years of education: Not on file   Highest education level: Master's degree (e.g., MA, MS, MEng, MEd, MSW, MBA)  Occupational History   Occupation: Recruiter  Tobacco Use   Smoking status: Never   Smokeless tobacco: Never  Vaping Use   Vaping status: Never Used  Substance and Sexual Activity   Alcohol use: Not Currently    Alcohol/week: 3.0 standard drinks of alcohol    Types: 3 Cans of beer per week    Comment: 07/2023 quit   Drug use: Never   Sexual activity: Yes  Other Topics Concern   Not on file  Social History Narrative   Not on file   Social Drivers of Health   Financial Resource Strain: Low Risk  (07/15/2023)   Overall Financial Resource Strain (CARDIA)    Difficulty of Paying Living Expenses: Not hard at all  Food Insecurity: No Food Insecurity (07/15/2023)   Hunger Vital Sign    Worried About Running Out of Food in the Last Year: Never true    Ran Out of Food in the Last Year: Never true  Transportation Needs: No Transportation Needs (07/15/2023)   PRAPARE - Administrator, Civil Service (Medical): No    Lack of Transportation (Non-Medical): No  Physical Activity: Sufficiently Active (07/15/2023)   Exercise Vital Sign    Days of Exercise per Week: 6 days    Minutes of Exercise per Session: 40 min  Stress: No Stress Concern Present (07/15/2023)   [employer] of Occupational Health - Occupational Stress Questionnaire    Feeling of Stress : Not at all  Social Connections: Socially Integrated (07/15/2023)   Social Connection and Isolation Panel [NHANES]    Frequency of  Communication with Friends and Family: Twice a week    Frequency of Social Gatherings with Friends and Family: More than three times a week    Attends Religious Services: More than 4 times per year    Active Member of [employer] or Organizations: Yes    Attends Engineer, structural: More than 4 times per year    Marital Status: Married  Catering manager Violence: Not on file    Review of Systems 13 point review of systems per patient health survey noted.  Negative other than as indicated above or in HPI.    Objective:   Vitals:   07/16/23 0832  BP: 126/70  Pulse: (!) 59  Temp: 97.8 F (36.6 C)  TempSrc: Temporal  SpO2: 100%  Weight: 165 lb 3.2 oz (74.9 kg)  Height: 5\' 8"  (1.727 m)     Physical Exam Vitals reviewed.  Constitutional:      Appearance: He is well-developed.  HENT:     Head: Normocephalic and atraumatic.     Right Ear: External ear normal.     Left Ear: External ear normal.  Eyes:     Conjunctiva/sclera: Conjunctivae normal.     Pupils: Pupils are equal, round, and reactive to light.  Neck:     Thyroid: No thyromegaly.  Cardiovascular:     Rate and Rhythm: Normal rate and regular rhythm.     Heart sounds: Normal heart sounds.  Pulmonary:     Effort: Pulmonary effort is normal. No respiratory distress.     Breath sounds: Normal breath sounds. No wheezing.  Abdominal:     General: There is no distension.     Palpations: Abdomen is soft.     Tenderness: There is no abdominal tenderness.  Musculoskeletal:        General: Tenderness (Minimal tenderness at medial epicondyle on left elbow, otherwise pain-free on exam and pain-free range of motion.) present. Normal range of motion.     Cervical back: Normal range of motion and neck supple.  Lymphadenopathy:  Cervical: No cervical adenopathy.  Skin:    General: Skin is warm and dry.  Neurological:     Mental Status: He is alert and oriented to person, place, and time.     Deep Tendon Reflexes:  Reflexes are normal and symmetric.  Psychiatric:        Behavior: Behavior normal.     Assessment & Plan:  Dennis Crawford is a 63 y.o. male . Annual physical exam  Hyperlipidemia, unspecified hyperlipidemia type  Decreased GFR - Plan: Basic metabolic panel  Hyponatremia - Plan: Basic metabolic panel  Need for pneumococcal vaccination - Plan: Pneumococcal conjugate vaccine 20-valent (Prevnar 20)  -anticipatory guidance as below in AVS, screening labs above. Health maintenance items as above in HPI discussed/recommended as applicable.  -Repeat BMP for borderline sodium, borderline creatinine.  May have been relative volume depletion of that he also takes creatinine supplement.  Adjustment of plan accordingly based on results.  Plans on lab only visit when he is well-hydrated, defers today. -Possible early/mild left medial epicondylitis.  Activity modification with RTC precautions given.  Handout given. -Prevnar given. -Option of additional dose or 2 of statin during the week as borderline LDL, previous elevated coronary calcium score. -32-month follow-up to monitor labs and A1c which is slightly higher today.  He is very active, no med changes for now.  No orders of the defined types were placed in this encounter.  Patient Instructions  Thanks for coming in today.  Recheck lab work at [facility] location below in the next week or 2.  If any concerns on the creatinine or sodium level on repeat testing, I will let you know.  Make sure you are well hydrated at that time. [facility] or xray: Walk in 8:30-4:30 during weekdays, no appointment needed [address]  [place], [place] [address]  Left elbow pain could be a mild form of golfers elbow.  See information below.  Avoid activities that cause discomfort in that area.  Follow-up if not improving  Preventive Care 51-69 Years Old, Male Preventive care refers to lifestyle choices and visits with your health care provider that can  promote health and wellness. Preventive care visits are also called wellness exams. What can I expect for my preventive care visit? Counseling During your preventive care visit, your health care provider may ask about your: Medical history, including: Past medical problems. Family medical history. Current health, including: Emotional well-being. Home life and relationship well-being. Sexual activity. Lifestyle, including: Alcohol, nicotine or tobacco, and drug use. Access to firearms. Diet, exercise, and sleep habits. Safety issues such as seatbelt and bike helmet use. Sunscreen use. Work and work Astronomer. Physical exam Your health care provider will check your: Height and weight. These may be used to calculate your BMI (body mass index). BMI is a measurement that tells if you are at a healthy weight. Waist circumference. This measures the distance around your waistline. This measurement also tells if you are at a healthy weight and may help predict your risk of certain diseases, such as type 2 diabetes and high blood pressure. Heart rate and blood pressure. Body temperature. Skin for abnormal spots. What immunizations do I need?  Vaccines are usually given at various ages, according to a schedule. Your health care provider will recommend vaccines for you based on your age, medical history, and lifestyle or other factors, such as travel or where you work. What tests do I need? Screening Your health care provider may recommend screening tests for  certain conditions. This may include: Lipid and cholesterol levels. Diabetes screening. This is done by checking your blood sugar (glucose) after you have not eaten for a while (fasting). Hepatitis B test. Hepatitis C test. HIV (human immunodeficiency virus) test. STI (sexually transmitted infection) testing, if you are at risk. Lung cancer screening. Prostate cancer screening. Colorectal cancer screening. Talk with your health  care provider about your test results, treatment options, and if necessary, the need for more tests. Follow these instructions at home: Eating and drinking  Eat a diet that includes fresh fruits and vegetables, whole grains, lean protein, and low-fat dairy products. Take vitamin and mineral supplements as recommended by your health care provider. Do not drink alcohol if your health care provider tells you not to drink. If you drink alcohol: Limit how much you have to 0-2 drinks a day. Know how much alcohol is in your drink. In the [place] one drink equals one 12 oz bottle of beer (355 mL), one 5 oz glass of wine (148 mL), or one 1 oz glass of hard liquor (44 mL). Lifestyle Brush your teeth every morning and night with fluoride toothpaste. Floss one time each day. Exercise for at least 30 minutes 5 or more days each week. Do not use any products that contain nicotine or tobacco. These products include cigarettes, chewing tobacco, and vaping devices, such as e-cigarettes. If you need help quitting, ask your health care provider. Do not use drugs. If you are sexually active, practice safe sex. Use a condom or other form of protection to prevent STIs. Take aspirin only as told by your health care provider. Make sure that you understand how much to take and what form to take. Work with your health care provider to find out whether it is safe and beneficial for you to take aspirin daily. Find healthy ways to manage stress, such as: Meditation, yoga, or listening to music. Journaling. Talking to a trusted person. Spending time with friends and family. Minimize exposure to UV radiation to reduce your risk of skin cancer. Safety Always wear your seat belt while driving or riding in a vehicle. Do not drive: If you have been drinking alcohol. Do not ride with someone who has been drinking. When you are tired or distracted. While texting. If you have been using any mind-altering substances or  drugs. Wear a helmet and other protective equipment during sports activities. If you have firearms in your house, make sure you follow all gun safety procedures. What's next? Go to your health care provider once a year for an annual wellness visit. Ask your health care provider how often you should have your eyes and teeth checked. Stay up to date on all vaccines. This information is not intended to replace advice given to you by your health care provider. Make sure you discuss any questions you have with your health care provider. Document Revised: 12/12/2020 Document Reviewed: 12/12/2020 Elsevier Patient Education  2024 Elsevier Inc.   Golfer's Elbow  Golfer's elbow (medial epicondylitis) is a condition that results from inflammation of the strong bands of tissue (tendons) that attach your forearm muscles to the inside of your bone at the elbow. These tendons affect the muscles that bend the palm toward the wrist (flexion). The tendons become less flexible with age. This condition is called golfer's elbow because it is more common among people who constantly bend and twist their wrists, such as golfers. This injury is usually caused by repeated use of the same muscles. What  are the causes? This condition is caused by: Repeatedly flexing, turning, or twisting your wrist. Frequently gripping objects with your hands. Sudden injury. What increases the risk? This condition is more likely to develop in people who play golf, baseball, or tennis. This injury is more common among people who have jobs that require the constant use of their hands, such as: People who use computers. Carpenters. Butchers. Musicians. What are the signs or symptoms? This condition causes elbow pain that may spread to your forearm and upper arm. Symptoms of this condition include: Pain at the inner elbow, forearm, or wrist. A weak grip in the hand. The pain may get worse when you bend your wrist downward. How is  this diagnosed? This condition is diagnosed based on your symptoms, your medical history, and a physical exam. During the exam, your health care provider may: Test your grip strength. Move your wrist to check for pain. You may also have an MRI to: Confirm the diagnosis. Look for other issues. Check for tears in the ligaments, muscles, or tendons. How is this treated? Treatment for this condition includes: Stopping all activities that make you bend or twist your elbow or wrist and waiting until your pain and other symptoms go away before resuming those activities. Wearing an elbow brace or wrist splint to restrict the movements that cause symptoms. Icing your inner elbow, forearm, or wrist to relieve pain. Taking NSAIDs, such as ibuprofen, or getting corticosteroid injections to reduce pain and swelling. Doing stretching, range-of-motion, and strengthening exercises (physical therapy) as told by your health care provider. In rare cases, surgery may be needed if your condition does not improve. Follow these instructions at home: If you have a brace or splint: Wear the brace or splint as told by your health care provider. Remove it only as told by your health care provider. Check the skin around the brace or splint every day. Tell your health care provider about any concerns. Loosen the brace or splint if your fingers tingle, become numb, or turn cold and blue. Keep it clean. If the brace or splint is not waterproof: Do not let it get wet. Cover it with a watertight covering when you take a bath or shower. Managing pain, stiffness, and swelling  If directed, put ice on the injured area. To do this: If you have a removable brace or splint, remove it as told by your health care provider. Put ice in a plastic bag. Place a towel between your skin and the bag. Leave the ice on for 20 minutes, 2-3 times a day. Remove the ice if your skin turns bright red. This is very important. If you cannot  feel pain, heat, or cold, you have a greater risk of damage to the area. Move your fingers often to avoid stiffness and swelling. Activity Rest your injured area as told by your health care provider. Return to your normal activities as told by your health care provider. Ask your health care provider what activities are safe for you. Do exercises as told by your health care provider. Lifestyle If your condition is caused by sports, work with a trainer to make sure that you: Use the correct technique. Use the proper equipment. If your condition is work related, talk with your employer about ways to manage your condition at work. General instructions Take over-the-counter and prescription medicines only as told by your health care provider. Do not use any products that contain nicotine or tobacco. These products include cigarettes, chewing tobacco,  and vaping devices, such as e-cigarettes. If you need help quitting, ask your health care provider. Keep all follow-up visits. This is important. How is this prevented? Before and after activity: Warm up and stretch before being active. Cool down and stretch after being active. Give your body time to rest between periods of activity. During activity: Make sure to use equipment that fits you. If you play golf, slow your golf swing to reduce shock in the arm when making contact with the ball. Maintain physical fitness, including: Strength. Flexibility. Endurance. Do exercises to strengthen the forearm muscles. Contact a health care provider if: Your pain does not improve or it gets worse. You notice numbness in your hand. Get help right away if: Your pain is severe. You cannot move your wrist. Summary Golfer's elbow, also called medial epicondylitis, is a condition that results from inflammation of the strong bands of tissue (tendons) that attach your forearm muscles to the inside of your bone at the elbow. This injury usually results from  overuse. Symptoms of this condition include decreased grip strength and pain at the inner elbow, forearm, or wrist. This injury is treated with rest, a brace or splint, ice, medicines, physical therapy, and surgery as needed. This information is not intended to replace advice given to you by your health care provider. Make sure you discuss any questions you have with your health care provider. Document Revised: 12/27/2019 Document Reviewed: 12/27/2019 Elsevier Patient Education  2024 Elsevier Inc.       Signed,   Meredith Staggers, MD [facility], [facility] 07/16/23 9:19 AM

## 2023-07-16 NOTE — Patient Instructions (Addendum)
Thanks for coming in today.  Recheck lab work at [facility] location below in the next week or 2.  If any concerns on the creatinine or sodium level on repeat testing, I will let you know.  Make sure you are well hydrated at that time. [facility] or xray: Walk in 8:30-4:30 during weekdays, no appointment needed [address]  [place], [place] [address]  Left elbow pain could be a mild form of golfers elbow.  See information below.  Avoid activities that cause discomfort in that area.  Follow-up if not improving  Preventive Care 38-35 Years Old, Male Preventive care refers to lifestyle choices and visits with your health care provider that can promote health and wellness. Preventive care visits are also called wellness exams. What can I expect for my preventive care visit? Counseling During your preventive care visit, your health care provider may ask about your: Medical history, including: Past medical problems. Family medical history. Current health, including: Emotional well-being. Home life and relationship well-being. Sexual activity. Lifestyle, including: Alcohol, nicotine or tobacco, and drug use. Access to firearms. Diet, exercise, and sleep habits. Safety issues such as seatbelt and bike helmet use. Sunscreen use. Work and work Astronomer. Physical exam Your health care provider will check your: Height and weight. These may be used to calculate your BMI (body mass index). BMI is a measurement that tells if you are at a healthy weight. Waist circumference. This measures the distance around your waistline. This measurement also tells if you are at a healthy weight and may help predict your risk of certain diseases, such as type 2 diabetes and high blood pressure. Heart rate and blood pressure. Body temperature. Skin for abnormal spots. What immunizations do I need?  Vaccines are usually given at various ages, according to a schedule. Your health care provider will  recommend vaccines for you based on your age, medical history, and lifestyle or other factors, such as travel or where you work. What tests do I need? Screening Your health care provider may recommend screening tests for certain conditions. This may include: Lipid and cholesterol levels. Diabetes screening. This is done by checking your blood sugar (glucose) after you have not eaten for a while (fasting). Hepatitis B test. Hepatitis C test. HIV (human immunodeficiency virus) test. STI (sexually transmitted infection) testing, if you are at risk. Lung cancer screening. Prostate cancer screening. Colorectal cancer screening. Talk with your health care provider about your test results, treatment options, and if necessary, the need for more tests. Follow these instructions at home: Eating and drinking  Eat a diet that includes fresh fruits and vegetables, whole grains, lean protein, and low-fat dairy products. Take vitamin and mineral supplements as recommended by your health care provider. Do not drink alcohol if your health care provider tells you not to drink. If you drink alcohol: Limit how much you have to 0-2 drinks a day. Know how much alcohol is in your drink. In the [place] one drink equals one 12 oz bottle of beer (355 mL), one 5 oz glass of wine (148 mL), or one 1 oz glass of hard liquor (44 mL). Lifestyle Brush your teeth every morning and night with fluoride toothpaste. Floss one time each day. Exercise for at least 30 minutes 5 or more days each week. Do not use any products that contain nicotine or tobacco. These products include cigarettes, chewing tobacco, and vaping devices, such as e-cigarettes. If you need help quitting, ask your health care provider. Do  not use drugs. If you are sexually active, practice safe sex. Use a condom or other form of protection to prevent STIs. Take aspirin only as told by your health care provider. Make sure that you understand how much to take  and what form to take. Work with your health care provider to find out whether it is safe and beneficial for you to take aspirin daily. Find healthy ways to manage stress, such as: Meditation, yoga, or listening to music. Journaling. Talking to a trusted person. Spending time with friends and family. Minimize exposure to UV radiation to reduce your risk of skin cancer. Safety Always wear your seat belt while driving or riding in a vehicle. Do not drive: If you have been drinking alcohol. Do not ride with someone who has been drinking. When you are tired or distracted. While texting. If you have been using any mind-altering substances or drugs. Wear a helmet and other protective equipment during sports activities. If you have firearms in your house, make sure you follow all gun safety procedures. What's next? Go to your health care provider once a year for an annual wellness visit. Ask your health care provider how often you should have your eyes and teeth checked. Stay up to date on all vaccines. This information is not intended to replace advice given to you by your health care provider. Make sure you discuss any questions you have with your health care provider. Document Revised: 12/12/2020 Document Reviewed: 12/12/2020 Elsevier Patient Education  2024 [employer]   Golfer's Elbow  Golfer's elbow (medial epicondylitis) is a condition that results from inflammation of the strong bands of tissue (tendons) that attach your forearm muscles to the inside of your bone at the elbow. These tendons affect the muscles that bend the palm toward the wrist (flexion). The tendons become less flexible with age. This condition is called golfer's elbow because it is more common among people who constantly bend and twist their wrists, such as golfers. This injury is usually caused by repeated use of the same muscles. What are the causes? This condition is caused by: Repeatedly flexing, turning, or  twisting your wrist. Frequently gripping objects with your hands. Sudden injury. What increases the risk? This condition is more likely to develop in people who play golf, baseball, or tennis. This injury is more common among people who have jobs that require the constant use of their hands, such as: People who use computers. Carpenters. Butchers. Musicians. What are the signs or symptoms? This condition causes elbow pain that may spread to your forearm and upper arm. Symptoms of this condition include: Pain at the inner elbow, forearm, or wrist. A weak grip in the hand. The pain may get worse when you bend your wrist downward. How is this diagnosed? This condition is diagnosed based on your symptoms, your medical history, and a physical exam. During the exam, your health care provider may: Test your grip strength. Move your wrist to check for pain. You may also have an MRI to: Confirm the diagnosis. Look for other issues. Check for tears in the ligaments, muscles, or tendons. How is this treated? Treatment for this condition includes: Stopping all activities that make you bend or twist your elbow or wrist and waiting until your pain and other symptoms go away before resuming those activities. Wearing an elbow brace or wrist splint to restrict the movements that cause symptoms. Icing your inner elbow, forearm, or wrist to relieve pain. Taking NSAIDs, such as ibuprofen, or  getting corticosteroid injections to reduce pain and swelling. Doing stretching, range-of-motion, and strengthening exercises (physical therapy) as told by your health care provider. In rare cases, surgery may be needed if your condition does not improve. Follow these instructions at home: If you have a brace or splint: Wear the brace or splint as told by your health care provider. Remove it only as told by your health care provider. Check the skin around the brace or splint every day. Tell your health care provider  about any concerns. Loosen the brace or splint if your fingers tingle, become numb, or turn cold and blue. Keep it clean. If the brace or splint is not waterproof: Do not let it get wet. Cover it with a watertight covering when you take a bath or shower. Managing pain, stiffness, and swelling  If directed, put ice on the injured area. To do this: If you have a removable brace or splint, remove it as told by your health care provider. Put ice in a plastic bag. Place a towel between your skin and the bag. Leave the ice on for 20 minutes, 2-3 times a day. Remove the ice if your skin turns bright red. This is very important. If you cannot feel pain, heat, or cold, you have a greater risk of damage to the area. Move your fingers often to avoid stiffness and swelling. Activity Rest your injured area as told by your health care provider. Return to your normal activities as told by your health care provider. Ask your health care provider what activities are safe for you. Do exercises as told by your health care provider. Lifestyle If your condition is caused by sports, work with a trainer to make sure that you: Use the correct technique. Use the proper equipment. If your condition is work related, talk with your employer about ways to manage your condition at work. General instructions Take over-the-counter and prescription medicines only as told by your health care provider. Do not use any products that contain nicotine or tobacco. These products include cigarettes, chewing tobacco, and vaping devices, such as e-cigarettes. If you need help quitting, ask your health care provider. Keep all follow-up visits. This is important. How is this prevented? Before and after activity: Warm up and stretch before being active. Cool down and stretch after being active. Give your body time to rest between periods of activity. During activity: Make sure to use equipment that fits you. If you play golf,  slow your golf swing to reduce shock in the arm when making contact with the ball. Maintain physical fitness, including: Strength. Flexibility. Endurance. Do exercises to strengthen the forearm muscles. Contact a health care provider if: Your pain does not improve or it gets worse. You notice numbness in your hand. Get help right away if: Your pain is severe. You cannot move your wrist. Summary Golfer's elbow, also called medial epicondylitis, is a condition that results from inflammation of the strong bands of tissue (tendons) that attach your forearm muscles to the inside of your bone at the elbow. This injury usually results from overuse. Symptoms of this condition include decreased grip strength and pain at the inner elbow, forearm, or wrist. This injury is treated with rest, a brace or splint, ice, medicines, physical therapy, and surgery as needed. This information is not intended to replace advice given to you by your health care provider. Make sure you discuss any questions you have with your health care provider. Document Revised: 12/27/2019 Document Reviewed: 12/27/2019 Elsevier  Patient Education  2024 [employer].

## 2023-07-22 ENCOUNTER — Other Ambulatory Visit (INDEPENDENT_AMBULATORY_CARE_PROVIDER_SITE_OTHER): Payer: BC Managed Care – PPO

## 2023-07-22 DIAGNOSIS — E871 Hypo-osmolality and hyponatremia: Secondary | ICD-10-CM | POA: Diagnosis not present

## 2023-07-22 DIAGNOSIS — R944 Abnormal results of kidney function studies: Secondary | ICD-10-CM

## 2023-07-22 LAB — BASIC METABOLIC PANEL
BUN: 18 mg/dL (ref 6–23)
CO2: 29 meq/L (ref 19–32)
Calcium: 9.5 mg/dL (ref 8.4–10.5)
Chloride: 102 meq/L (ref 96–112)
Creatinine, Ser: 1.24 mg/dL (ref 0.40–1.50)
GFR: 62.41 mL/min (ref >60.00)
Glucose, Bld: 98 mg/dL (ref 70–99)
Potassium: 4.1 meq/L (ref 3.5–5.1)
Sodium: 139 meq/L (ref 135–145)

## 2023-07-26 ENCOUNTER — Encounter: Payer: Self-pay | Admitting: Family Medicine

## 2023-08-26 ENCOUNTER — Encounter: Payer: Managed Care, Other (non HMO) | Admitting: Family Medicine

## 2024-03-24 ENCOUNTER — Other Ambulatory Visit: Payer: Self-pay | Admitting: Family Medicine

## 2024-03-24 DIAGNOSIS — E785 Hyperlipidemia, unspecified: Secondary | ICD-10-CM

## 2024-09-28 ENCOUNTER — Encounter: Admitting: Family Medicine
